# Patient Record
Sex: Male | Born: 1980 | Race: Black or African American | Hispanic: No | Marital: Single | Smoking: Never smoker
Health system: Southern US, Community
[De-identification: ages and names within clinical notes are randomized; demographics above are authoritative.]

## PROBLEM LIST (undated history)

## (undated) DIAGNOSIS — I10 Essential (primary) hypertension: Secondary | ICD-10-CM

## (undated) HISTORY — DX: Morbid (severe) obesity due to excess calories: E66.01

## (undated) HISTORY — DX: Essential (primary) hypertension: I10

---

## 2009-05-07 ENCOUNTER — Emergency Department (HOSPITAL_COMMUNITY): Admission: EM | Admit: 2009-05-07 | Discharge: 2009-05-07 | Payer: Self-pay | Admitting: Emergency Medicine

## 2009-06-06 ENCOUNTER — Emergency Department (HOSPITAL_COMMUNITY): Admission: EM | Admit: 2009-06-06 | Discharge: 2009-06-07 | Payer: Self-pay | Admitting: Emergency Medicine

## 2009-06-13 ENCOUNTER — Emergency Department (HOSPITAL_COMMUNITY): Admission: EM | Admit: 2009-06-13 | Discharge: 2009-06-13 | Payer: Self-pay | Admitting: Emergency Medicine

## 2009-11-28 IMAGING — CT CT MAXILLOFACIAL W/O CM
3 series · 18 of 30 positions shown, 20 images · non-contrast
Comparison: None

CLINICAL DATA: Trauma to the left side.  Swelling.

CT MAXILLOFACIAL WITHOUT CONTRAST
TECHNIQUE: Multidetector CT imaging of the maxillofacial
structures was performed. Multiplanar CT image reconstructions were
also generated.

[Series 3: recon 2: supine facial bones · axial · 0.51mm/px · z∈[-226,-88]mm · 6 of 78 slices shown, 8 images]
[im 12/78  brain]
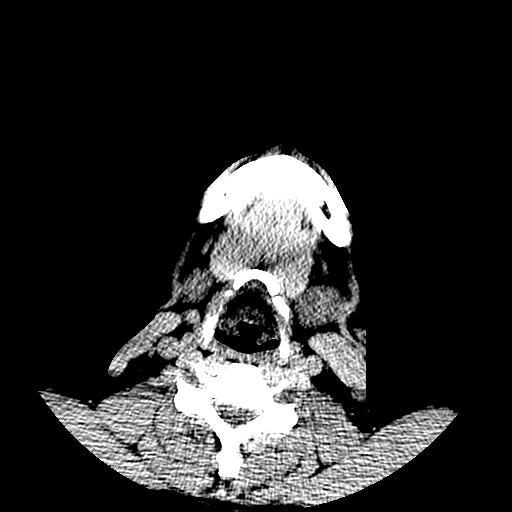
[im 12/78  bone]
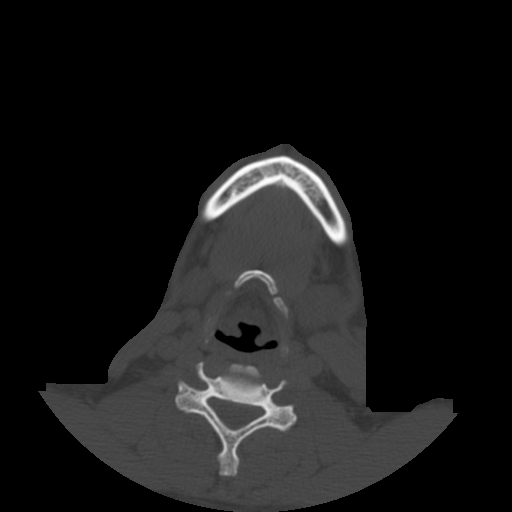
[im 23/78  bone]
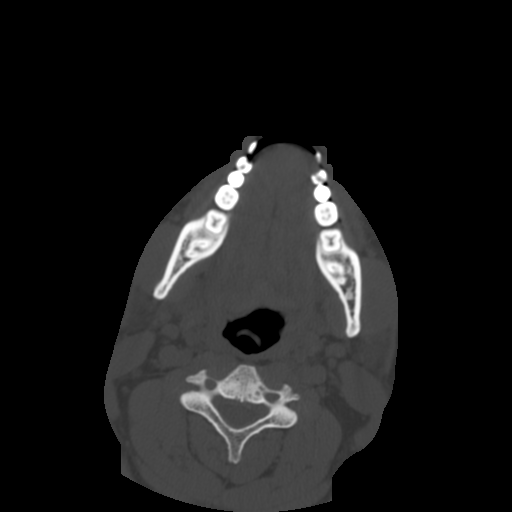
[im 34/78  bone]
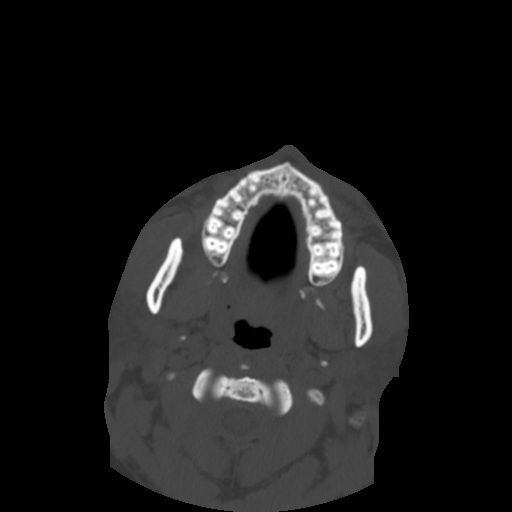
[im 45/78  bone]
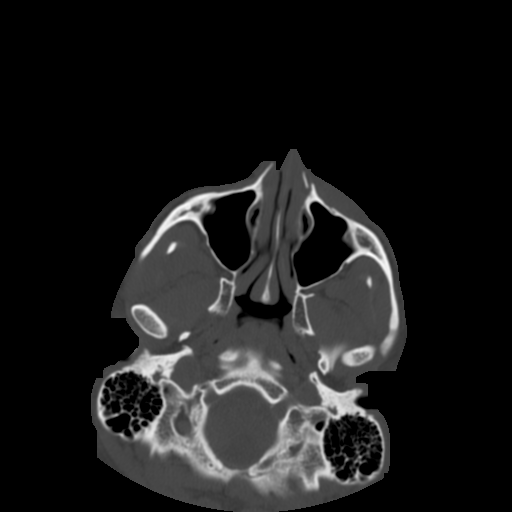
[im 56/78  brain]
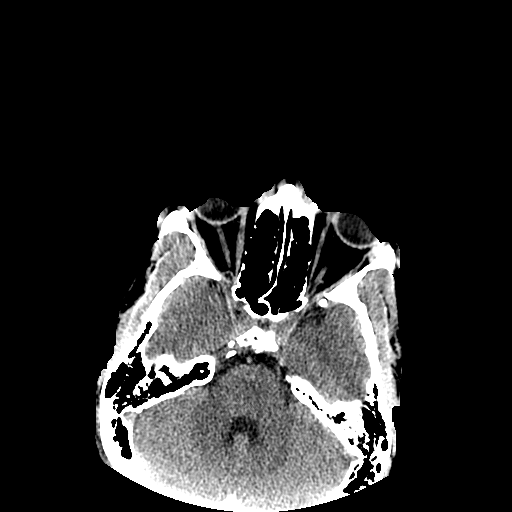
[im 56/78  bone]
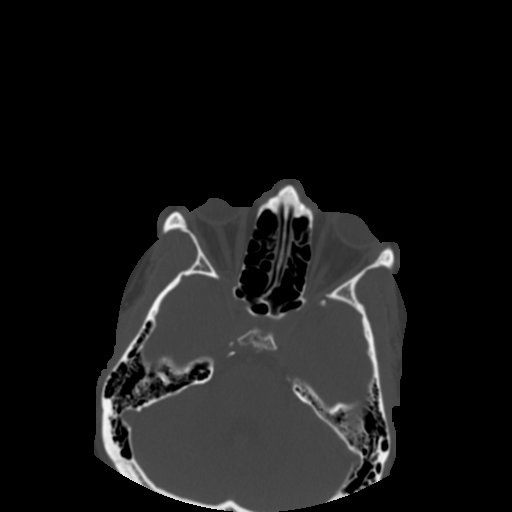
[im 67/78  bone]
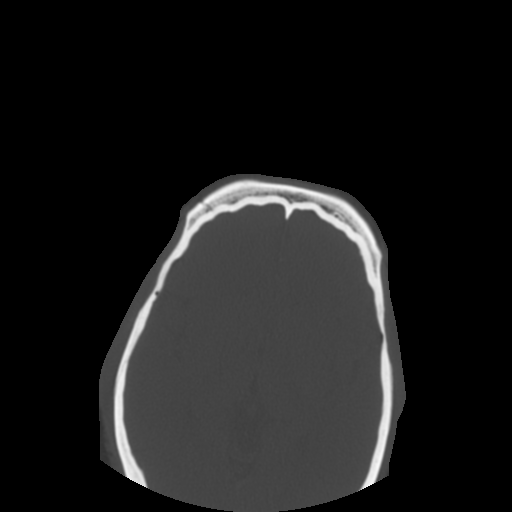

[Series 400: reformatted · sagittal · 0.51mm/px · 8 of 101 slices shown (1 of 2)]
[im 11/101  bone]
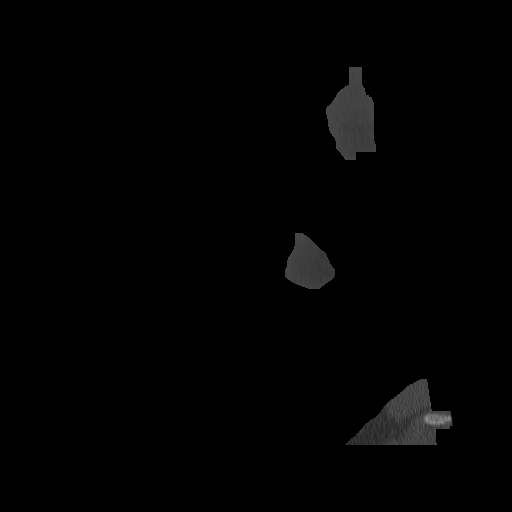
[im 21/101  bone]
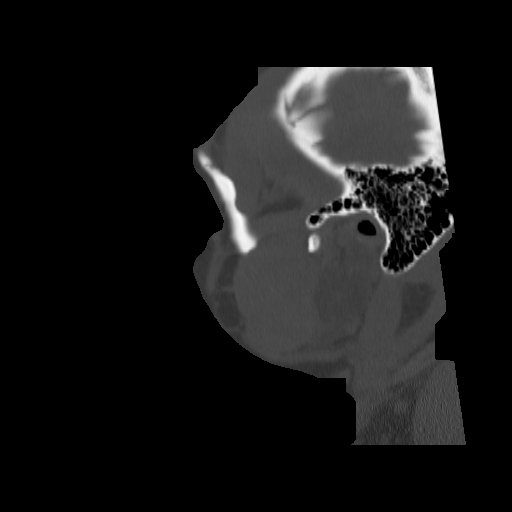
[im 31/101  bone]
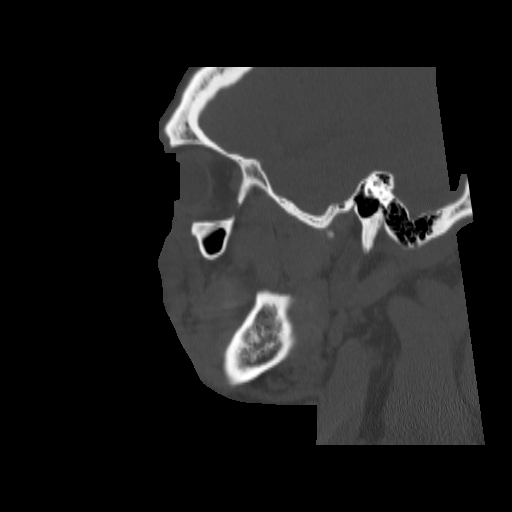
[im 41/101  bone]
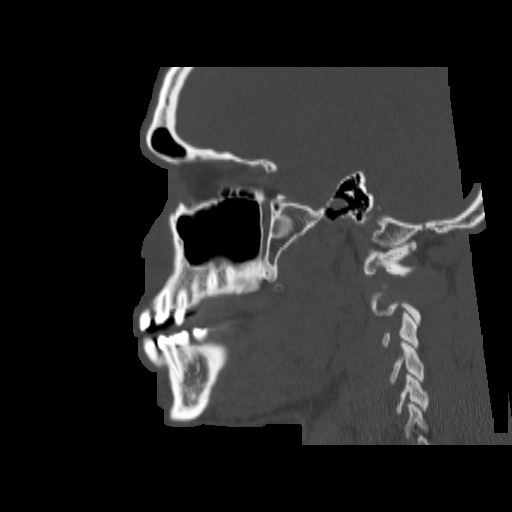
[im 61/101  bone]
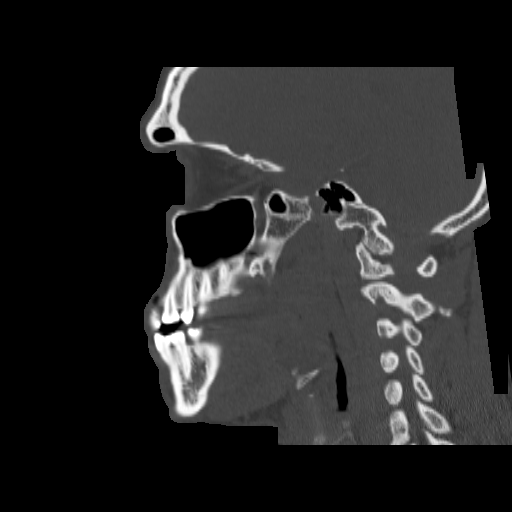
[im 71/101  bone]
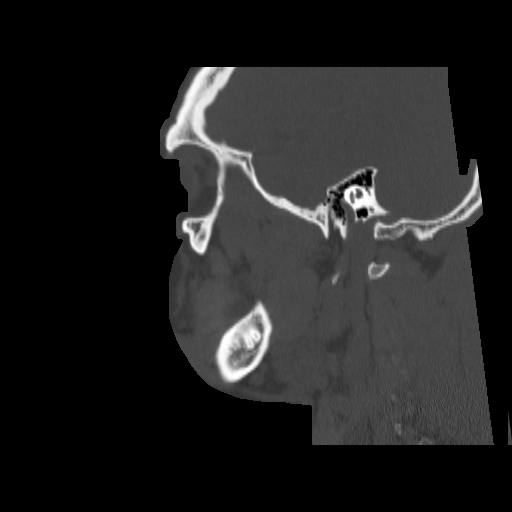
[im 81/101  bone]
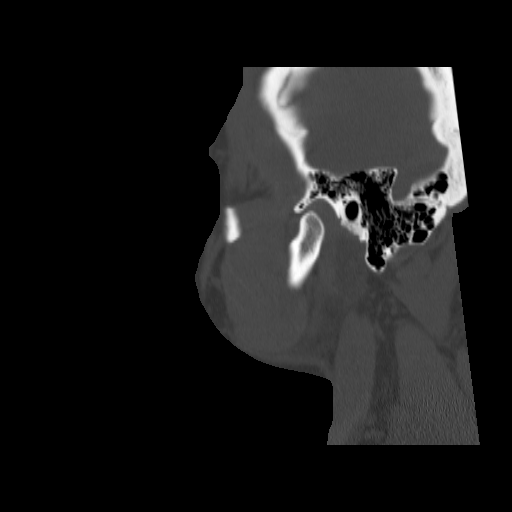
[im 91/101  bone]
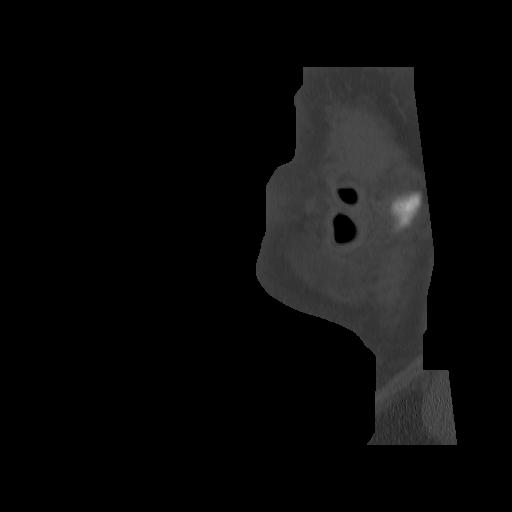

[Series 401: reformatted · coronal · 0.51mm/px · 4 of 103 slices shown (2 of 2)]
[im 11/103  bone]
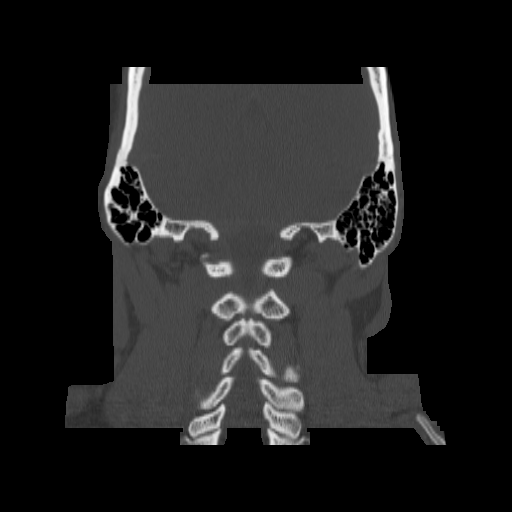
[im 21/103  bone]
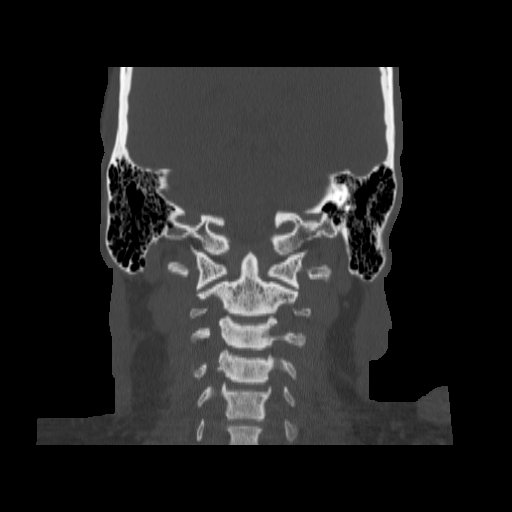
[im 31/103  bone]
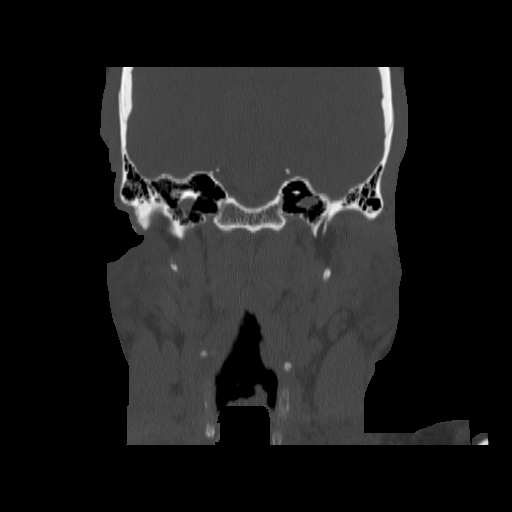
[im 41/103  bone]
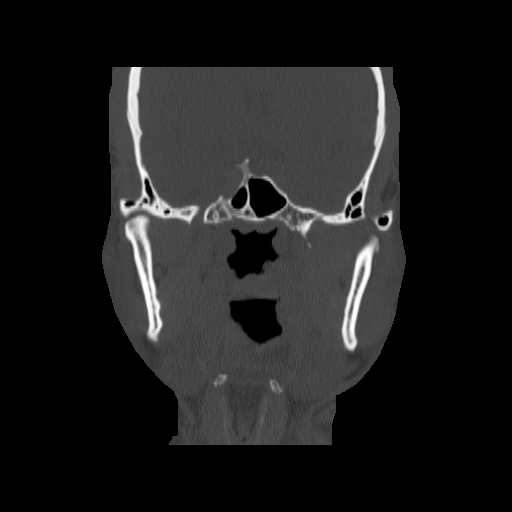

[18 of 30 positions shown; findings below may reference images not displayed]

FINDINGS: There are comminuted and depressed fractures of the nasal
bones on the left.  The nasal spine of the maxilla is intact.  No
other facial fractures seen.  No fluid in the sinuses.  There is
soft tissue swelling of the left cheek and periorbital soft
tissues.
IMPRESSION: Comminuted and depressed nasal fractures on the left.

## 2010-11-16 ENCOUNTER — Encounter: Payer: Self-pay | Admitting: Emergency Medicine

## 2018-06-06 ENCOUNTER — Ambulatory Visit: Payer: 59 | Admitting: Neurology

## 2018-06-06 VITALS — BP 144/102 | HR 72 | Ht 70.0 in | Wt 343.0 lb

## 2018-06-06 DIAGNOSIS — J351 Hypertrophy of tonsils: Secondary | ICD-10-CM | POA: Diagnosis not present

## 2018-06-06 DIAGNOSIS — J988 Other specified respiratory disorders: Secondary | ICD-10-CM

## 2018-06-06 DIAGNOSIS — Z6841 Body Mass Index (BMI) 40.0 and over, adult: Secondary | ICD-10-CM

## 2018-06-06 DIAGNOSIS — J4520 Mild intermittent asthma, uncomplicated: Secondary | ICD-10-CM

## 2018-06-06 DIAGNOSIS — Z9189 Other specified personal risk factors, not elsewhere classified: Secondary | ICD-10-CM | POA: Diagnosis not present

## 2018-06-06 DIAGNOSIS — R030 Elevated blood-pressure reading, without diagnosis of hypertension: Secondary | ICD-10-CM

## 2018-06-06 NOTE — Patient Instructions (Signed)

## 2018-06-06 NOTE — Progress Notes (Signed)
Subjective:    Patient ID: [name] [name] is a 37 y.o. male.  HPI     Huston FoleySaima Abdou Stocks, MD, PhD [facility] [address], [address] [address] [place], [place] [id number]  Dear [name] CramJanece,   I saw your patient, [name] [name], upon your kind request in my neurologic clinic today for initial consultation of his sleep disorder, in particular, concern for underlying obstructive sleep apnea. The patient is unaccompanied today. As you know, [name] is a 37 year old right-handed gentleman with an underlying medical history of hypertension and morbid obesity with BMI of over 45, who is at risk for obstructive sleep apnea and was advised to have a sleep study to rule out OSA based on his DOT physical as I understand. I reviewed your office note from 05/12/2018, which you kindly included. His Epworth sleepiness score is 4 out of 24, fatigue score is 9 out of 63. He is single and lives with his mother and his girlfriend. He has no children. He is a nonsmoker and does not utilize alcohol, drinks caffeine in the form of soda 2 per day and energy drinks 1 or 2 per day on average. He works at [employer], has a CDL. He has to be at work at [employer]6:15. Bedtime is around 10 PM and rise to him around 5:15. He denies night to night nocturia or restless leg symptoms. He denies morning headaches. He is not aware of any family history of OSA. His weight has been fluctuating, he is trying to lose weight and has lost about 5 pounds recently. He used to work third shift. He has a second part-time job. He is generally home by 7 PM. He is not aware of any snoring, girlfriend does not complain about it. He does not wake up gasping for air. He does take a nap in between his 2 jobs sometimes. His blood pressure is elevated today. He takes his blood pressure medicine each night around 11 PM. He watches TV in bed until he gets sleepy and turns it off. No pets in the bedroom.  His Past Medical  History Is Significant For: Past Medical History:  Diagnosis Date  . Benign hypertension   . Morbid obesity (HCC)     His Past Surgical History Is Significant For:  His Family History Is Significant For: No family history on file.  His Social History Is Significant For: Social History   Socioeconomic History  . Marital status: Single    Spouse name: Not on file  . Number of children: Not on file  . Years of education: Not on file  . Highest education level: Not on file  Occupational History  . Not on file  Social Needs  . Financial resource strain: Not on file  . Food insecurity:    Worry: Not on file    Inability: Not on file  . Transportation needs:    Medical: Not on file    Non-medical: Not on file  Tobacco Use  . Smoking status: Not on file  Substance and Sexual Activity  . Alcohol use: Not on file  . Drug use: Not on file  . Sexual activity: Not on file  Lifestyle  . Physical activity:    Days per week: Not on file    Minutes per session: Not on file  . Stress: Not on file  Relationships  . Social connections:    Talks on phone: Not on file    Gets together: Not on file  Attends religious service: Not on file    Active member of club or organization: Not on file    Attends meetings of clubs or organizations: Not on file    Relationship status: Not on file  Other Topics Concern  . Not on file  Social History Narrative  . Not on file    His Allergies Are:  No Known Allergies:   His Current Medications Are:  Outpatient Encounter Medications as of 06/06/2018  Medication Sig  . olmesartan-hydrochlorothiazide (BENICAR HCT) 20-12.5 MG tablet Take 1 tablet by mouth daily.  . [DISCONTINUED] telmisartan-hydrochlorothiazide (MICARDIS HCT) 40-12.5 MG tablet Take 1 tablet by mouth daily.   No facility-administered encounter medications on file as of 06/06/2018.   :  Review of Systems:  Out of a complete 14 point review of systems, all are reviewed and  negative with the exception of these symptoms as listed below:  Review of Systems  Neurological:       Pt presents today to discuss his sleep. Pt does not endorse snoring and has never had a sleep study.  Epworth Sleepiness Scale 0= would never doze 1= slight chance of dozing 2= moderate chance of dozing 3= high chance of dozing  Sitting and reading: 0 Watching TV: 1 Sitting inactive in a public place (ex. Theater or meeting): 0 As a passenger in a car for an hour without a break: 0 Lying down to rest in the afternoon: 3 Sitting and talking to someone: 0 Sitting quietly after lunch (no alcohol): 0 In a car, while stopped in traffic: 0 Total: 4    Objective:  Neurological Exam  Physical Exam Physical Examination:   Vitals:   06/06/18 0911  BP: (!) 144/102  Pulse: 72    General Examination: The patient is a very pleasant 37 y.o. male in no acute distress. He appears well-developed and well-nourished and well groomed.   HEENT: Normocephalic, atraumatic, pupils are equal, round and reactive to light and accommodation. Funduscopic exam is normal with sharp disc margins noted. Extraocular tracking is good without limitation to gaze excursion or nystagmus noted. Normal smooth pursuit is noted. Hearing is grossly intact. Tympanic membranes are clear bilaterally. Face is symmetric with normal facial animation and normal facial sensation. Speech is clear with no dysarthria noted. There is no hypophonia. There is no lip, neck/head, jaw or voice tremor. Neck is supple with full range of passive and active motion. There are no carotid bruits on auscultation. Oropharynx exam reveals: mild mouth dryness, adequate dental hygiene and marked airway crowding, due to smaller airway entry, thicker soft palate, large uvula and tonsils in place which were not fully visualized, Mallampati is class III. Tongue protrudes centrally and palate elevates symmetrically. Neck circumference is enlarged at 20-3/4  inches. He has a mild overbite. Nasal inspection reveals no significant mucosal swelling or redness or septal deviation or significant inferior turbinate hypertrophy.  Chest: Clear to auscultation without wheezing, rhonchi or crackles noted.  Heart: S1+S2+0, regular and normal without murmurs, rubs or gallops noted.   Abdomen: Soft, non-tender and non-distended with normal bowel sounds appreciated on auscultation.  Extremities: There is no pitting edema in the distal lower extremities bilaterally.   Skin: Warm and dry without trophic changes noted.  Musculoskeletal: exam reveals no obvious joint deformities, tenderness or joint swelling or erythema.   Neurologically:  Mental status: The patient is awake, alert and oriented in all 4 spheres. His immediate and remote memory, attention, language skills and fund of knowledge are appropriate.  There is no evidence of aphasia, agnosia, apraxia or anomia. Speech is clear with normal prosody and enunciation. Thought process is linear. Mood is normal and affect is normal.  Cranial nerves II - XII are as described above under HEENT exam. In addition: shoulder shrug is normal with equal shoulder height noted. Motor exam: Normal bulk, strength and tone is noted. There is no drift, tremor or rebound. Romberg is negative. Reflexes are 1+ throughout. Fine motor skills and coordination: intact with normal finger taps, normal hand movements, normal rapid alternating patting, normal foot taps and normal foot agility.  Cerebellar testing: No dysmetria or intention tremor on finger to nose testing. Heel to shin is slightly difficult d/t  Body habitus.   Sensory exam: intact to light touch in the upper and lower extremities.  Gait, station and balance: He stands easily. No veering to one side is noted. No leaning to one side is noted. Posture is age-appropriate and stance is narrow based. Gait shows normal stride length and normal pace. No problems turning are noted.  Tandem walk is slightly difficult d/t body habitus.    Assessment and Plan:  In summary, Jabe [name] is a very pleasant 37 y.o.-year old male with an underlying medical history of hypertension and morbid obesity with BMI of over 45, who presents for sleep evaluation because of underlying risk for obstructive sleep apnea. His history is not telltale for obstructive sleep apnea, nevertheless, based on morbid obesity, a crowded airway, and enlarged neck circumference, the pretest probability for obstructive sleep apnea is rather high. I explained my findings to the patient and advised him, that some patients do not have observed apneas or symptoms from underlying obstructive sleep apnea and it would be in his best medical interest to rule out OSA for which a lab attended sleep study is indicated. OSA cannot be ruled out based on a negative home sleep test.  I had a long chat with the patient about my findings and the diagnosis of OSA, its prognosis and treatment options. We talked about medical treatments, surgical interventions and non-pharmacological approaches. I explained in particular the risks and ramifications of untreated moderate to severe OSA, especially with respect to developing cardiovascular disease down the Road, including congestive heart failure, difficult to treat hypertension, cardiac arrhythmias, or stroke. Even type 2 diabetes has, in part, been linked to untreated OSA. Symptoms of untreated OSA include daytime sleepiness, memory problems, mood irritability and mood disorder such as depression and anxiety, lack of energy, as well as recurrent headaches, especially morning headaches. We talked about trying to maintain a healthy lifestyle in general, as well as the importance of weight control. I encouraged the patient to eat healthy, exercise daily and keep well hydrated, to keep a scheduled bedtime and wake time routine, to not skip any meals and eat healthy snacks in between meals. I  advised the patient not to drive when feeling sleepy. I recommended the following at this time: sleep study with potential positive airway pressure titration. (We will score hypopneas at 4%).   I explained the sleep test procedure to the patient and also outlined possible treatment, including the use of CPAP. He indicated that he would be willing to try CPAP if the need arises. I explained the importance of being compliant with PAP treatment, not only for insurance purposes but primarily to improve His symptoms, and for the patient's long term health benefit, including to reduce His cardiovascular risks. I answered all his questions today and the patient  was in agreement. I plan to see him back after the sleep study is completed and encouraged him to call with any interim questions, concerns, problems or updates.   Thank you very much for allowing me to participate in the care of this nice patient. If I can be of any further assistance to you please do not hesitate to call me at [phone].  Sincerely,   Star Age, MD, PhD

## 2018-06-15 ENCOUNTER — Ambulatory Visit: Payer: 59 | Admitting: Neurology

## 2018-06-15 DIAGNOSIS — J4520 Mild intermittent asthma, uncomplicated: Secondary | ICD-10-CM

## 2018-06-15 DIAGNOSIS — G4733 Obstructive sleep apnea (adult) (pediatric): Secondary | ICD-10-CM

## 2018-06-15 DIAGNOSIS — R030 Elevated blood-pressure reading, without diagnosis of hypertension: Secondary | ICD-10-CM

## 2018-06-15 DIAGNOSIS — Z9189 Other specified personal risk factors, not elsewhere classified: Secondary | ICD-10-CM

## 2018-06-15 DIAGNOSIS — Z6841 Body Mass Index (BMI) 40.0 and over, adult: Secondary | ICD-10-CM

## 2018-06-15 DIAGNOSIS — G4720 Circadian rhythm sleep disorder, unspecified type: Secondary | ICD-10-CM

## 2018-06-15 DIAGNOSIS — J988 Other specified respiratory disorders: Secondary | ICD-10-CM

## 2018-06-15 DIAGNOSIS — J351 Hypertrophy of tonsils: Secondary | ICD-10-CM

## 2018-06-19 ENCOUNTER — Telehealth: Payer: Self-pay

## 2018-06-19 NOTE — Addendum Note (Signed)
Addended by: Huston FoleyATHAR, Aiman Noe on: 06/19/2018 07:55 AM   Modules accepted: Orders

## 2018-06-19 NOTE — Telephone Encounter (Signed)
-----   Message from Huston FoleySaima Athar, MD sent at 06/19/2018  7:55 AM EDT ----- Patient referred by Arnette FeltsJanece Moore, NP, seen by me on 06/06/18, diagnostic PSG on 06/15/18.   Please call and notify the patient that the recent sleep study showed obstructive sleep apnea, in the mild to moderate range. I recommend treatment for this in the form of CPAP. This will require a repeat sleep study for proper titration and mask fitting and correct monitoring of the oxygen saturations. Please explain to patient. I have placed an order in the chart. Thanks.  Huston FoleySaima Athar, MD, PhD [facility])

## 2018-06-19 NOTE — Telephone Encounter (Signed)
I called pt to discuss his sleep study results. No answer, VM is full. Will try again later. 

## 2018-06-19 NOTE — Progress Notes (Signed)
See report

## 2018-06-19 NOTE — Procedures (Signed)
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 His Epworth Sleepiness Score is 1/24 points. The patient's weight 343 pounds with a height of 70 (inches), resulting in a BMI of 49.2 kg/m2. The patient's neck circumference measured 20.5 inches.  CURRENT MEDICATIONS: Benicar   PROCEDURE:  This is a multichannel digital polysomnogram utilizing the Somnostar 11.2 system.  Electrodes and sensors were applied and monitored per AASM Specifications.   EEG, EOG, Chin and Limb EMG, were sampled at 200 Hz.  ECG, Snore and Nasal Pressure, Thermal Airflow, Respiratory Effort, CPAP Flow and Pressure, Oximetry was sampled at 50 Hz. Digital video and audio were recorded.      BASELINE STUDY  Lights Out was at 21:12 and Lights On at 04:30, patient request an early rise time for work. Total recording time (TRT) was 438.5 minutes, with a total sleep time (TST) of 390.5 minutes.   The patient's sleep latency was 22.5 minutes.  REM latency was 82 minutes, which is normal. The sleep efficiency was 89.1 %.     SLEEP ARCHITECTURE: WASO (Wake after sleep onset) was 25.5 minutes with minimal to mild sleep fragmentation noted. There were 29.5 minutes in Stage N1, 246 minutes Stage N2, 42 minutes Stage N3 and 73 minutes in Stage REM.  The percentage of Stage N1 was 7.6%, Stage N2 was 63.%, which is increased, Stage N3 was 10.8% and Stage R (REM sleep) was 18.7%, which is near-normal. The arousals were noted as: 40 were spontaneous, 1 were associated with PLMs, 5 were associated with respiratory events.  RESPIRATORY ANALYSIS:  There were a total of 57 respiratory events:  4  obstructive apneas, 0 central apneas and 0 mixed apneas with a total of 4 apneas and an apnea index (AI) of .6 /hour. There were 53 hypopneas with a hypopnea index of 8.1 /hour. The patient also had 0 respiratory event related arousals (RERAs).      The total APNEA/HYPOPNEA INDEX (AHI) was 8.8/hour and the total RESPIRATORY DISTURBANCE INDEX was 8.8 /hour.  19 events occurred in REM sleep and 68 events in NREM. The REM AHI was 15.6 /hour, versus a non-REM AHI of 7.2. The patient spent 189 minutes of total sleep time in the supine position and 202 minutes in non-supine.. The supine AHI was 5.7 versus a non-supine AHI of 11.6.  OXYGEN SATURATION & C02:  The Wake baseline 02 saturation was 94%, with the lowest being 78%. Time spent below 89% saturation equaled 18 minutes.    PERIODIC LIMB MOVEMENTS: The patient had a total of 52 Periodic Limb Movements.  The Periodic Limb Movement (PLM) index was 8. and the PLM Arousal index was .2/hour. Audio and video analysis did not show any abnormal or unusual movements, behaviors, phonations or vocalizations. The patient took 1 bathroom break. Snoring was noted, ranging from mild to loud. The EKG was in keeping with normal sinus rhythm (NSR).  Post-study, the patient indicated that sleep was worse than usual.   IMPRESSION:  1. Obstructive Sleep Apnea (OSA) 2. Dysfunctions associated with sleep stages or arousal from  sleep  RECOMMENDATIONS:  1. This study demonstrates overall mild obstructive sleep apnea, moderate during REM sleep with a total AHI of 8.8/hour, REM AHI of 15.6/hour, and O2 nadir of 78%. Given the patient's medical history, and his DOT licensed, treatment with positive airway pressure is recommended. A full-night CPAP titration study is recommended to optimize therapy and for correct monitoring the O2 saturations. Weight loss is highly recommended. Other treatment options may include upper airway or jaw surgery in selected patients or the use of an  oral appliance in certain patients. ENT evaluation and/or consultation with a maxillofacial surgeon or dentist may be feasible in some instances.    2. This study shows minimal to mild sleep fragmentation and mildly abnormal sleep stage percentages; these are nonspecific findings and per se do not signify an intrinsic sleep disorder or a cause for the patient's sleep-related symptoms. Causes include (but are not limited to) the first night effect of the sleep study, circadian rhythm disturbances, medication effect or an underlying mood disorder or medical problem.  3. The patient should be cautioned not to drive, work at heights, or operate dangerous or heavy equipment when tired or sleepy. Review and reiteration of good sleep hygiene measures should be pursued with any patient. 4. The patient will be seen in follow-up in the sleep clinic at [facility] for discussion of the test results, symptom and treatment compliance review, further management strategies, etc. The referring provider will be notified of the test results.  I certify that I have reviewed the entire raw data recording prior to the issuance of this report in accordance with the Standards of Accreditation of the American Academy of Sleep Medicine (AASM)   Huston Foley, MD, PhD Diplomat, American Board of Neurology and Sleep Medicine (Neurology and Sleep Medicine)

## 2018-06-19 NOTE — Progress Notes (Signed)
Patient referred by Arnette FeltsJanece Moore, NP, seen by me on 06/06/18, diagnostic PSG on 06/15/18.   Please call and notify the patient that the recent sleep study showed obstructive sleep apnea, in the mild to moderate range. I recommend treatment for this in the form of CPAP. This will require a repeat sleep study for proper titration and mask fitting and correct monitoring of the oxygen saturations. Please explain to patient. I have placed an order in the chart. Thanks.  Huston FoleySaima Breken Nazari, MD, PhD [facility]

## 2018-06-19 NOTE — Telephone Encounter (Signed)
I called pt. I advised pt that Dr. Athar reviewed their sleep study results and found that pt has mild to moderate osa and recommends that pt be treated with a cpap. Dr. Athar recommends that pt return for a repeat sleep study in order to properly titrate the cpap and ensure a good mask fit. Pt is agreeable to returning for a titration study. I advised pt that our sleep lab will file with pt's insurance and call pt to schedule the sleep study when we hear back from the pt's insurance regarding coverage of this sleep study. Pt verbalized understanding of results. Pt had no questions at this time but was encouraged to call back if questions arise.   

## 2018-06-26 ENCOUNTER — Ambulatory Visit (INDEPENDENT_AMBULATORY_CARE_PROVIDER_SITE_OTHER): Payer: 59 | Admitting: Neurology

## 2018-06-26 DIAGNOSIS — J351 Hypertrophy of tonsils: Secondary | ICD-10-CM

## 2018-06-26 DIAGNOSIS — R030 Elevated blood-pressure reading, without diagnosis of hypertension: Secondary | ICD-10-CM

## 2018-06-26 DIAGNOSIS — G4733 Obstructive sleep apnea (adult) (pediatric): Secondary | ICD-10-CM

## 2018-06-26 DIAGNOSIS — Z9189 Other specified personal risk factors, not elsewhere classified: Secondary | ICD-10-CM

## 2018-06-26 DIAGNOSIS — J4520 Mild intermittent asthma, uncomplicated: Secondary | ICD-10-CM

## 2018-06-26 DIAGNOSIS — G4720 Circadian rhythm sleep disorder, unspecified type: Secondary | ICD-10-CM

## 2018-06-26 DIAGNOSIS — Z6841 Body Mass Index (BMI) 40.0 and over, adult: Secondary | ICD-10-CM

## 2018-06-29 ENCOUNTER — Telehealth: Payer: Self-pay

## 2018-06-29 NOTE — Telephone Encounter (Signed)
-----   Message from Huston Foley, MD sent at 06/29/2018  8:30 AM EDT ----- Patient referred by Arnette Felts, NP, seen by me on 06/06/18, diagnostic PSG on 06/15/18. Patient had a CPAP titration study on 06/26/18.  Please call and inform patient that I have entered an order for treatment with positive airway pressure (PAP) treatment for obstructive sleep apnea (OSA). He did well during the latest sleep study with CPAP. We will, therefore, arrange for a machine for home use through a DME (durable medical equipment) company of His choice; and I will see the patient back in follow-up in about 10 weeks. Please also explain to the patient that I will be looking out for compliance data, which can be downloaded from the machine (stored on an SD card, that is inserted in the machine) or via remote access through a modem, that is built into the machine. At the time of the followup appointment we will discuss sleep study results and how it is going with PAP treatment at home. Please advise patient to bring His machine at the time of the first FU visit, even though this is cumbersome. Bringing the machine for every visit after that will likely not be needed, but often helps for the first visit to troubleshoot if needed. Please re-enforce the importance of compliance with treatment and the need for [place] to monitor compliance data - often an insurance requirement and actually good feedback for the patient as far as how they are doing.  Also remind patient, that any interim PAP machine or mask issues should be first addressed with the DME company, as they can often help better with technical and mask fit issues. Please ask if patient has a preference regarding DME company.  Please also make sure, the patient has a follow-up appointment with me in about 10 weeks from the setup date, thanks. May see one of our nurse practitioners if needed for proper timing of the FU appointment.  Please fax or rout report to the referring provider.  Thanks,   Huston Foley, MD, PhD [facility])

## 2018-06-29 NOTE — Addendum Note (Signed)
Addended by: Huston Foley on: 06/29/2018 08:30 AM   Modules accepted: Orders

## 2018-06-29 NOTE — Progress Notes (Signed)
Patient referred by Arnette Felts, NP, seen by me on 06/06/18, diagnostic PSG on 06/15/18. Patient had a CPAP titration study on 06/26/18.  Please call and inform patient that I have entered an order for treatment with positive airway pressure (PAP) treatment for obstructive sleep apnea (OSA). He did well during the latest sleep study with CPAP. We will, therefore, arrange for a machine for home use through a DME (durable medical equipment) company of His choice; and I will see the patient back in follow-up in about 10 weeks. Please also explain to the patient that I will be looking out for compliance data, which can be downloaded from the machine (stored on an SD card, that is inserted in the machine) or via remote access through a modem, that is built into the machine. At the time of the followup appointment we will discuss sleep study results and how it is going with PAP treatment at home. Please advise patient to bring His machine at the time of the first FU visit, even though this is cumbersome. Bringing the machine for every visit after that will likely not be needed, but often helps for the first visit to troubleshoot if needed. Please re-enforce the importance of compliance with treatment and the need for [place] to monitor compliance data - often an insurance requirement and actually good feedback for the patient as far as how they are doing.  Also remind patient, that any interim PAP machine or mask issues should be first addressed with the DME company, as they can often help better with technical and mask fit issues. Please ask if patient has a preference regarding DME company.  Please also make sure, the patient has a follow-up appointment with me in about 10 weeks from the setup date, thanks. May see one of our nurse practitioners if needed for proper timing of the FU appointment.  Please fax or rout report to the referring provider. Thanks,   Huston Foley, MD, PhD [facility])

## 2018-06-29 NOTE — Procedures (Signed)
PATIENT'S NAME:  [name], [name] DOB:      Jun 02, 1981      MR#:    [id number]     DATE OF RECORDING: 06/26/2018 REFERRING M.D.:  Arnette Felts, FNP Study Performed:   CPAP  Titration HISTORY:  37 year old man with a history of hypertension and morbid obesity with BMI of over 45, who returns for a CPAP titration study. His baseline PSG on 06/15/18 showed an AHI of 8.8/hour and low spo2 of 78%. The patient endorsed the Epworth Sleepiness Scale at 1 points. The patient's weight 344 pounds with a height of 70 (inches), resulting in a BMI of 49.2 kg/m2. The patient's neck circumference measured 20 inches.  CURRENT MEDICATIONS: Benicar   PROCEDURE:  This is a multichannel digital polysomnogram utilizing the SomnoStar 11.2 system.  Electrodes and sensors were applied and monitored per AASM Specifications.   EEG, EOG, Chin and Limb EMG, were sampled at 200 Hz.  ECG, Snore and Nasal Pressure, Thermal Airflow, Respiratory Effort, CPAP Flow and Pressure, Oximetry was sampled at 50 Hz. Digital video and audio were recorded.      The patient was fitted with MW Dreamwear nasal interface. CPAP was initiated at 5 cmH20 with heated humidity per AASM standards and pressure was advanced to 11 cm H20 because of hypopneas, apneas and desaturations.  At a PAP pressure of 11 cmH20, there was a reduction of the AHI to 0/hour with non-supine REM sleep achieved and O2 nadir of 92%.  Lights Out was at 21:50 and Lights On at 03:59. Total recording time (TRT) was 370 minutes, with a total sleep time (TST) of 345 minutes. The patient's sleep latency was 14.5 minutes. REM latency was 59.5 minutes, which is mildly reduced. The sleep efficiency was 93.2%.    SLEEP ARCHITECTURE: WASO (Wake after sleep onset) was 10.5 minutes with minimal sleep fragmentation noted. There were 16.5 minutes in Stage N1, 139.5 minutes Stage N2, 89 minutes Stage N3 and 100 minutes in Stage REM. The percentage of Stage N1 was 4.8%, Stage N2 was 40.4%, Stage N3  was 25.8%, which is mildly increased, and Stage R (REM sleep) was 29%, which is mildly increased and in keeping with rebound.   RESPIRATORY ANALYSIS:  There was a total of 9 respiratory events: 0 obstructive apneas, 0 central apneas and 0 mixed apneas with a total of 0 apneas and an apnea index (AI) of 0 /hour. There were 9 hypopneas with a hypopnea index of 1.6/hour. The patient also had 0 respiratory event related arousals (RERAs).      The total APNEA/HYPOPNEA INDEX (AHI) was 1.6 /hour and the total RESPIRATORY DISTURBANCE INDEX was 1.6 /hour  6 events occurred in REM sleep and 3 events in NREM. The REM AHI was 3.6 /hour versus a non-REM AHI of .7 /hour.  The patient spent 130 minutes of total sleep time in the supine position and 215 minutes in non-supine. The supine AHI was 2.3, versus a non-supine AHI of 1.1.  OXYGEN SATURATION & C02:  The baseline 02 saturation was 96%, with the lowest being 86%. Time spent below 89% saturation equaled 1 minutes.  PERIODIC LIMB MOVEMENTS:  The patient had a total of 0 Periodic Limb Movements. The Periodic Limb Movement (PLM) index was 0 and the PLM Arousal index was 0 /hour.   Audio and video analysis did not show any abnormal or unusual movements, behaviors, phonations or vocalizations. The patient took no bathroom breaks. The EKG was in keeping with normal sinus rhythm.  Post-study, the patient indicated that sleep was the same as usual (breathing was better, he stated).   IMPRESSION:   1. Obstructive Sleep Apnea (OSA) 2. Dysfunctions associated with sleep stages or arousal from sleep   RECOMMENDATIONS:   1. This study demonstrates resolution of the patient's obstructive sleep apnea with CPAP therapy. I will, therefore, start the patient on home CPAP treatment at a pressure of 11 cm via mediumwide nasal cushion interface with heated humidity. The patient should be reminded to be fully compliant with PAP therapy to improve sleep related symptoms and  decrease long term cardiovascular risks. The patient should be reminded, that it may take up to 3 months to get fully used to using PAP with all planned sleep. The earlier full compliance is achieved, the better long term compliance tends to be. Please note that untreated obstructive sleep apnea may carry additional perioperative morbidity. Patients with significant obstructive sleep apnea should receive perioperative PAP therapy and the surgeons and particularly the anesthesiologist should be informed of the diagnosis and the severity of the sleep disordered breathing. 2. This study shows minimal sleep fragmentation and mildly abnormal sleep stage percentages; these are nonspecific findings and per se do not signify an intrinsic sleep disorder or a cause for the patient's sleep-related symptoms. Causes include (but are not limited to) the first night effect of the sleep study, circadian rhythm disturbances, medication effect or an underlying mood disorder or medical problem.  3. The patient should be cautioned not to drive, work at heights, or operate dangerous or heavy equipment when tired or sleepy. Review and reiteration of good sleep hygiene measures should be pursued with any patient. 4. The patient will be seen in follow-up in the sleep clinic at [facility] for discussion of the test results, symptom and treatment compliance review, further management strategies, etc. The referring provider will be notified of the test results.   I certify that I have reviewed the entire raw data recording prior to the issuance of this report in accordance with the Standards of Accreditation of the American Academy of Sleep Medicine (AASM)     Huston Foley, MD, PhD Diplomat, American Board of Neurology and Sleep Medicine (Neurology and Sleep Medicine)

## 2018-06-29 NOTE — Telephone Encounter (Signed)
I called pt. I advised pt that Dr. Frances Furbish reviewed their sleep study results and found that pt did well with the cpap during her latest sleep study. Dr. Frances Furbish recommends that pt start a cpap at home. I reviewed PAP compliance expectations with the pt. Pt is agreeable to starting a CPAP. I advised pt that an order will be sent to a DME, [facility], and [facility] will call the pt within about one week after they file with the pt's insurance. [facility] will show the pt how to use the machine, fit for masks, and troubleshoot the CPAP if needed. A follow up appt was made for insurance purposes with Dr. Frances Furbish on 09/19/18 at 3:00pm. Pt verbalized understanding to arrive 15 minutes early and bring their CPAP. A letter with all of this information in it will be mailed to the pt as a reminder. I verified with the pt that the address we have on file is correct. Pt verbalized understanding of results. Pt had no questions at this time but was encouraged to call back if questions arise.

## 2018-07-13 DIAGNOSIS — G4730 Sleep apnea, unspecified: Secondary | ICD-10-CM

## 2018-07-13 DIAGNOSIS — I10 Essential (primary) hypertension: Secondary | ICD-10-CM | POA: Diagnosis not present

## 2018-07-24 ENCOUNTER — Institutional Professional Consult (permissible substitution): Payer: Self-pay | Admitting: Neurology

## 2018-07-24 ENCOUNTER — Encounter

## 2018-09-15 ENCOUNTER — Telehealth: Payer: Self-pay

## 2018-09-15 NOTE — Telephone Encounter (Signed)
Attempted to call pt to see where he wants me to send his results to he did not provide a fax number for us. Left v/m to call office. YRL,RMA

## 2018-09-17 ENCOUNTER — Encounter: Payer: Self-pay | Admitting: Neurology

## 2018-09-19 ENCOUNTER — Encounter: Payer: Self-pay | Admitting: Neurology

## 2018-09-19 ENCOUNTER — Ambulatory Visit (INDEPENDENT_AMBULATORY_CARE_PROVIDER_SITE_OTHER): Payer: 59 | Admitting: Neurology

## 2018-09-19 VITALS — BP 114/70 | HR 77 | Ht 72.0 in | Wt 350.0 lb

## 2018-09-19 DIAGNOSIS — Z9989 Dependence on other enabling machines and devices: Secondary | ICD-10-CM

## 2018-09-19 DIAGNOSIS — G4733 Obstructive sleep apnea (adult) (pediatric): Secondary | ICD-10-CM

## 2018-09-19 NOTE — Progress Notes (Signed)
Subjective:    Patient ID: [name] [name] is a 37 y.o. male.  HPI     Interim history:   [name] is a 37 year old right-handed gentleman with an underlying medical history of hypertension and morbid obesity with BMI of over 67, who presents for follow-up consultation of his obstructive sleep apnea, after recent sleep study testing and starting CPAP therapy. The patient is unaccompanied today. 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 He was advised to proceed with sleep study testing. He had a baseline sleep study, followed by a CPAP titration study. His baseline sleep study from 06/15/2018 showed a sleep efficiency of 89.1%, sleep latency of 22.5 minutes, REM latency of 82 minutes. He had a total AHI of 8.8 per hour, rising to 15.6 per hour during REM sleep. Average oxygen saturation was 94%, nadir was 78%. He had no significant PLMS. He was advised to proceed with CPAP therapy and return for titration study. He had this on 06/26/2018. Sleep latency was 14.5 minutes, sleep efficiency 93.2%, REM latency borderline reduced at 59.5 minutes. He had slightly increased percentage of REM sleep at 29%. He was titrated on CPAP from 5 cm to 11 cm. On the final pressure his AHI was 0 per hour with nonsupine REM sleep achieved and O2 nadir of 92%. He had no significant PLMS during the study. Based on his test results I prescribed CPAP therapy for home use at a pressure of 11 cm.   Today, 09/19/2018: I reviewed his CPAP compliance data from 08/19/2018 through 09/17/2018 which is a total of 30 days, during which time he used his device 29 days with percent used days greater than 4 hours at 76.7%, indicating adequate compliance with an average usage of 4 hours and 47 minutes, residual AHI at goal at 1.3 per hour, leak on the higher end, CPAP pressure of 11 cm. He reports doing fairly well with his  CPAP, he tries to put it on consistently but sometimes does fall asleep without it. He has noticed some improvement in his ability to breathe at night, otherwise no telltale changes in his sleep quality or sleep consolidation.  The patient's allergies, current medications, family history, past medical history, past social history, past surgical history and problem list were reviewed and updated as appropriate.   Previously:   06/06/2018: (He) is at risk for obstructive sleep apnea and was advised to have a sleep study to rule out OSA based on his DOT physical as I understand. I reviewed your office note from 05/12/2018, which you kindly included. His Epworth sleepiness score is 4 out of 24, fatigue score is 9 out of 63. He is single and lives with his mother and his girlfriend. He has no children. He is a nonsmoker and does not utilize alcohol, drinks caffeine in the form of soda 2 per day and energy drinks 1 or 2 per day on average. He works at [employer], has a [place]. He has to be at work at [employer]. Bedtime is around 10 PM and rise to him around 5:15. He denies night to night nocturia or restless leg symptoms. He denies morning headaches. He is not aware of any family history of OSA. His weight has been fluctuating, he is trying to lose weight and has lost about 5 pounds recently. He used to work third shift. He  has a second part-time job. He is generally home by 7 PM. He is not aware of any snoring, girlfriend does not complain about it. He does not wake up gasping for air. He does take a nap in between his 2 jobs sometimes. His blood pressure is elevated today. He takes his blood pressure medicine each night around 11 PM. He watches TV in bed until he gets sleepy and turns it off. No pets in the bedroom.   His Past Medical History Is Significant For: Past Medical History:  Diagnosis Date  . Benign hypertension   . Morbid obesity ([place])     His Past Surgical History Is Significant  For: History reviewed. No pertinent surgical history.  His Family History Is Significant For: History reviewed. No pertinent family history.  His Social History Is Significant For: Social History   Socioeconomic History  . Marital status: Single    Spouse name: Not on file  . Number of children: Not on file  . Years of education: Not on file  . Highest education level: Not on file  Occupational History  . Not on file  Social Needs  . Financial resource strain: Not on file  . Food insecurity:    Worry: Not on file    Inability: Not on file  . Transportation needs:    Medical: Not on file    Non-medical: Not on file  Tobacco Use  . Smoking status: Never Smoker  . Smokeless tobacco: Never Used  Substance and Sexual Activity  . Alcohol use: Not on file  . Drug use: Never  . Sexual activity: Not on file  Lifestyle  . Physical activity:    Days per week: Not on file    Minutes per session: Not on file  . Stress: Not on file  Relationships  . Social connections:    Talks on phone: Not on file    Gets together: Not on file    Attends religious service: Not on file    Active member of club or organization: Not on file    Attends meetings of clubs or organizations: Not on file    Relationship status: Not on file  Other Topics Concern  . Not on file  Social History Narrative  . Not on file    His Allergies Are:  No Known Allergies:   His Current Medications Are:  Outpatient Encounter Medications as of 09/19/2018  Medication Sig  . olmesartan-hydrochlorothiazide (BENICAR HCT) 20-12.5 MG tablet Take 1 tablet by mouth daily.   No facility-administered encounter medications on file as of 09/19/2018.   :  Review of Systems:  Out of a complete 14 point review of systems, all are reviewed and negative with the exception of these symptoms as listed below: Review of Systems  Neurological:       Patient mentioned that since he has been using his cpap machine he noticed  that his breathing has improved.    Objective:  Neurological Exam  Physical Exam Physical Examination:   Vitals:   09/19/18 1513  BP: 114/70  Pulse: 77    General Examination: The patient is a very pleasant 37 y.o. male in no acute distress. He appears well-developed and well-nourished and well groomed.   HEENT: Normocephalic, atraumatic, pupils are equal, round and reactive to light and accommodation. Extraocular tracking is good without limitation to gaze excursion or nystagmus noted. Normal smooth pursuit is noted. Hearing is grossly intact. Face is symmetric with normal facial animation and normal facial  sensation. Speech is clear with no dysarthria noted. There is no hypophonia. There is no lip, neck/head, jaw or voice tremor. Neck shows FROM. Oropharynx exam reveals: mild mouth dryness, adequate dental hygiene and marked airway crowding, Mallampati is class III. Tongue protrudes centrally and palate elevates symmetrically.   Chest: Clear to auscultation without wheezing, rhonchi or crackles noted.  Heart: S1+S2+0, regular and normal without murmurs, rubs or gallops noted.   Abdomen: Soft, non-tender and non-distended.  Extremities: There is no pitting edema in the distal lower extremities bilaterally.   Skin: Warm and dry without trophic changes noted.  Musculoskeletal: exam reveals no obvious joint deformities, tenderness or joint swelling or erythema.   Neurologically:  Mental status: The patient is awake, alert and oriented in all 4 spheres. His immediate and remote memory, attention, language skills and fund of knowledge are appropriate. There is no evidence of aphasia, agnosia, apraxia or anomia. Speech is clear with normal prosody and enunciation. Thought process is linear. Mood is normal and affect is normal.  Cranial nerves II - XII are as described above under HEENT exam. Motor exam: Normal bulk, strength and tone is noted. There is no drift, tremor or rebound.  Romberg is negative. Reflexes are 1+ throughout. Fine motor skills and coordination: intact with normal finger taps, normal hand movements, normal rapid alternating patting, normal foot taps and normal foot agility.  Cerebellar testing: No dysmetria or intention tremor.   Sensory exam: intact to light touch in the upper and lower extremities.  Gait, station and balance: He stands easily. No veering to one side is noted. No leaning to one side is noted. Posture is age-appropriate and stance is narrow based. Gait shows normal stride length and normal pace. No problems turning are noted. Tandem walk is slightly difficult d/t body habitus, stable.    Assessment and Plan:  In summary, Polo [name] is a very pleasant 37 year old male with an underlying medical history of hypertension and morbid obesity with BMI of over 15, who presents for follow up consultation of his obstructive sleep apnea after baseline sleep study testing in August 2019 followed by a CPAP titration study in May 2019. He has established treatment at home with CPAP therapy. He was encouraged to seek treatment for his mild sleep apnea. He is compliant with treatment and has noticed improvement in his breathing at night and also reports significant improvement in his snoring per girlfriend's report. He is commended on his treatment adherence, I encouraged him to try to stay on treatment consistently and try to increase his total sleep time as well. His average usage is nearly 5 hours at this point. He is advised to work on weight loss as this may very well improve his obstructive sleep apnea. He is advised to follow-up routinely in 6 months, sooner if needed, he can see one of our nurse practitioners next time. I answered all his questions today and he was in agreement. I spent 25 minutes in total face-to-face time with the patient, more than 50% of which was spent in counseling and coordination of care, reviewing test results, reviewing  medication and discussing or reviewing the diagnosis of OSA, its prognosis and treatment options. Pertinent laboratory and imaging test results that were available during this visit with the patient were reviewed by me and considered in my medical decision making (see chart for details).

## 2018-09-19 NOTE — Patient Instructions (Signed)
Please continue using your CPAP regularly. While your insurance requires that you use CPAP at least 4 hours each night on 70% of the nights, I recommend, that you not skip any nights and use it throughout the night if you can. Getting used to CPAP and staying with the treatment long term does take time and patience and discipline. Untreated obstructive sleep apnea when it is moderate to severe can have an adverse impact on cardiovascular health and raise her risk for heart disease, arrhythmias, hypertension, congestive heart failure, stroke and diabetes. Untreated obstructive sleep apnea causes sleep disruption, nonrestorative sleep, and sleep deprivation. This can have an impact on your day to day functioning and cause daytime sleepiness and impairment of cognitive function, memory loss, mood disturbance, and problems focussing. Using CPAP regularly can improve these symptoms.  Keep up the good work! We can see you in 6 months, you can see one of our nurse practitioners as you are stable.

## 2018-09-19 NOTE — Progress Notes (Deleted)
Subjective:    Patient ID: [name] [name] is a 37 y.o. male.  HPI {Common ambulatory SmartLinks:[id number]}  Review of Systems  Objective:  Neurological Exam  Physical Exam  Assessment:   ***  Plan:   ***

## 2018-10-12 ENCOUNTER — Ambulatory Visit: Payer: 59 | Admitting: Nurse Practitioner

## 2018-12-26 ENCOUNTER — Encounter: Payer: Self-pay | Admitting: Adult Health

## 2019-03-09 ENCOUNTER — Other Ambulatory Visit: Payer: Self-pay | Admitting: Nurse Practitioner

## 2019-03-22 ENCOUNTER — Encounter: Payer: Self-pay | Admitting: Nurse Practitioner

## 2019-03-22 ENCOUNTER — Other Ambulatory Visit: Payer: Self-pay

## 2019-03-22 ENCOUNTER — Ambulatory Visit: Payer: 59 | Admitting: Nurse Practitioner

## 2019-03-22 VITALS — BP 122/88 | HR 68 | Temp 98.3°F | Ht 70.4 in | Wt 356.0 lb

## 2019-03-22 DIAGNOSIS — Z6841 Body Mass Index (BMI) 40.0 and over, adult: Secondary | ICD-10-CM | POA: Diagnosis not present

## 2019-03-22 DIAGNOSIS — I10 Essential (primary) hypertension: Secondary | ICD-10-CM | POA: Diagnosis not present

## 2019-03-22 LAB — BMP8+EGFR
BUN/Creatinine Ratio: 8 — ABNORMAL LOW (ref 9–20)
BUN: 9 mg/dL (ref 6–24)
CO2: 24 mmol/L (ref 20–29)
Calcium: 9.7 mg/dL (ref 8.7–10.2)
Chloride: 102 mmol/L (ref 96–106)
Creatinine, Ser: 1.08 mg/dL (ref 0.76–1.27)
GFR calc Af Amer: 99 mL/min/{1.73_m2} (ref >59)
GFR calc non Af Amer: 85 mL/min/{1.73_m2} (ref >59)
Glucose: 90 mg/dL (ref 65–99)
Potassium: 4 mmol/L (ref 3.5–5.2)
Sodium: 142 mmol/L (ref 134–144)

## 2019-03-22 MED ORDER — OLMESARTAN MEDOXOMIL-HCTZ 20-12.5 MG PO TABS: 1.0000 | ORAL_TABLET | Freq: Every day | ORAL | 0 refills | Status: DC

## 2019-03-22 NOTE — Progress Notes (Signed)
  Subjective:     Patient ID: [name] [name] , male    DOB: 1981-08-07 , 38 y.o.   MRN: [id number]   Chief Complaint  Patient presents with  . Hypertension    HPI  Hypertension  This is a chronic problem. The current episode started more than 1 year ago. The problem is unchanged. The problem is controlled. Pertinent negatives include no anxiety or chest pain. There are no associated agents to hypertension. Risk factors for coronary artery disease include obesity, sedentary lifestyle and male gender. Past treatments include diuretics and angiotensin blockers. There are no compliance problems.  There is no history of angina. Identifiable causes of hypertension include chronic renal disease.     Past Medical History:  Diagnosis Date  . Benign hypertension   . Morbid obesity ([place])      No family history on file.   Current Outpatient Medications:  .  olmesartan-hydrochlorothiazide (BENICAR HCT) 20-12.5 MG tablet, Take 1 tablet by mouth daily., Disp: , Rfl:    No Known Allergies   Review of Systems  Cardiovascular: Negative for chest pain.     Today's Vitals   03/22/19 0926  BP: 122/88  Pulse: 68  Temp: 98.3 F (36.8 C)  TempSrc: Oral  Weight: (!) 356 lb (161.5 kg)  Height: 5' 10.4" (1.788 m)  PainSc: 0-No pain   Body mass index is 50.5 kg/m.   Objective:  Physical Exam Vitals signs reviewed.  Constitutional:      Appearance: Normal appearance. He is obese.     Comments: Central obesity  Cardiovascular:     Rate and Rhythm: Normal rate and regular rhythm.     Pulses: Normal pulses.     Heart sounds: Normal heart sounds. No murmur.  Pulmonary:     Effort: Pulmonary effort is normal.     Breath sounds: Normal breath sounds.  Skin:    General: Skin is warm and dry.     Capillary Refill: Capillary refill takes less than 2 seconds.  Neurological:     General: No focal deficit present.     Mental Status: He is alert and oriented to person, place, and time.   Psychiatric:        Mood and Affect: Mood normal.        Behavior: Behavior normal.        Thought Content: Thought content normal.        Judgment: Judgment normal.         Assessment And Plan:     1. Essential hypertension . B/P is controlled.  . CMP ordered to check renal function.  . The importance of regular exercise and dietary modification was stressed to the patient.  . Stressed importance of losing ten percent of her body weight to help with B/P control.  . The weight loss would help with decreasing cardiac and cancer risk as well.  - BMP8+eGFR  2. Morbid (severe) obesity due to excess calories [facility])  He has gained 13 lbs since September  I have discussed with him to drink a protein drink or boiled eggs with nuts if he does not want to eat.  Goal to lose 15 lbs by his HM visit in August  3. BMI 50.0-59.9, adult ([place])    Minette Brine, FNP    THE PATIENT IS ENCOURAGED TO PRACTICE SOCIAL DISTANCING DUE TO THE COVID-19 PANDEMIC.

## 2019-03-22 NOTE — Patient Instructions (Signed)
BMI for Adults    Body mass index (BMI) is a number that is calculated from a person's weight and height. BMI may help to estimate how much of a person's weight is composed of fat. BMI can help identify those who may be at higher risk for certain medical problems.  How is BMI used with adults?  BMI is used as a screening tool to identify possible weight problems. It is used to check whether a person is obese, overweight, healthy weight, or underweight.  How is BMI calculated?  BMI measures your weight and compares it to your height. This can be done either in English (U[place] or metric measurements. Note that charts are available to help you find your BMI quickly and easily without having to do these calculations yourself.  To calculate your BMI in English ([place] measurements, your health care provider will:  1. Measure your weight in pounds (lb).  2. Multiply the number of pounds by 703.  ? For example, for a person who weighs 180 lb, multiply that number by 703, which equals 126,540.  3. Measure your height in inches (in). Then multiply that number by itself to get a measurement called "inches squared."  ? For example, for a person who is 70 in tall, the "inches squared" measurement is 70 in x 70 in, which equals 4900 inches squared.  4. Divide the total from Step 2 (number of lb x 703) by the total from Step 3 (inches squared): 126,540 ÷ 4900 = 25.8. This is your BMI.  To calculate your BMI in metric measurements, your health care provider will:  1. Measure your weight in kilograms (kg).  2. Measure your height in meters (m). Then multiply that number by itself to get a measurement called "meters squared."  ? For example, for a person who is 1.75 m tall, the "meters squared" measurement is 1.75 m x 1.75 m, which is equal to 3.1 meters squared.  3. Divide the number of kilograms (your weight) by the meters squared number. In this example: 70 ÷ 3.1 = 22.6. This is your BMI.  How is BMI interpreted?  To interpret your  results, your health care provider will use BMI charts to identify whether you are underweight, normal weight, overweight, or obese. The following guidelines will be used:  · Underweight: BMI less than 18.5.  · Normal weight: BMI between 18.5 and 24.9.  · Overweight: BMI between 25 and 29.9.  · Obese: BMI of 30 and above.  Please note:  · Weight includes both fat and muscle, so someone with a muscular build, such as an athlete, may have a BMI that is higher than 24.9. In cases like these, BMI is not an accurate measure of body fat.  · To determine if excess body fat is the cause of a BMI of 25 or higher, further assessments may need to be done by a health care provider.  · BMI is usually interpreted in the same way for men and women.  Why is BMI a useful tool?  BMI is useful in two ways:  · Identifying a weight problem that may be related to a medical condition, or that may increase the risk for medical problems.  · Promoting lifestyle and diet changes in order to reach a healthy weight.  Summary  · Body mass index (BMI) is a number that is calculated from a person's weight and height.  · BMI may help to estimate how much of a person's weight is   composed of fat. BMI can help identify those who may be at higher risk for certain medical problems.  · BMI can be measured using English measurements or metric measurements.  · To interpret your results, your health care provider will use BMI charts to identify whether you are underweight, normal weight, overweight, or obese.  This information is not intended to replace advice given to you by your health care provider. Make sure you discuss any questions you have with your health care provider.  Document Released: 06/22/2004 Document Revised: 08/24/2017 Document Reviewed: 08/24/2017  Elsevier Interactive Patient Education © 2019 [employer]

## 2019-04-05 ENCOUNTER — Other Ambulatory Visit: Payer: Self-pay | Admitting: Nurse Practitioner

## 2019-04-05 DIAGNOSIS — I10 Essential (primary) hypertension: Secondary | ICD-10-CM

## 2019-04-09 ENCOUNTER — Telehealth: Payer: Self-pay

## 2019-04-09 NOTE — Telephone Encounter (Signed)
Ive attempted to call pt to reschedule his appointment due to [place] only seeing viturals on Tuesday afternoon I have been unable to leave a v/m the phone keeps beeping as if line is busy. YRL,RMA

## 2019-04-10 ENCOUNTER — Ambulatory Visit: Payer: Self-pay | Admitting: Nurse Practitioner

## 2019-04-12 ENCOUNTER — Ambulatory Visit: Payer: Self-pay | Admitting: Nurse Practitioner

## 2019-04-12 ENCOUNTER — Other Ambulatory Visit: Payer: Self-pay

## 2019-04-12 ENCOUNTER — Encounter: Payer: Self-pay | Admitting: Nurse Practitioner

## 2019-04-12 ENCOUNTER — Ambulatory Visit: Payer: 59 | Admitting: Nurse Practitioner

## 2019-04-12 VITALS — BP 124/88 | HR 73 | Temp 98.4°F | Ht 71.2 in | Wt 352.4 lb

## 2019-04-12 DIAGNOSIS — R252 Cramp and spasm: Secondary | ICD-10-CM | POA: Diagnosis not present

## 2019-04-12 DIAGNOSIS — M79605 Pain in left leg: Secondary | ICD-10-CM | POA: Diagnosis not present

## 2019-04-12 MED ORDER — CYCLOBENZAPRINE HCL 10 MG PO TABS: 10.0000 mg | ORAL_TABLET | Freq: Three times a day (TID) | ORAL | 0 refills | Status: DC | PRN

## 2019-04-12 NOTE — Progress Notes (Signed)
Subjective:     Patient ID: [name] [name] , male    DOB: 12-Nov-1980 , 38 y.o.   MRN: [id number]   Chief Complaint  Patient presents with  . Back Pain    patient states he was having back pain that is moving different places    HPI   He is describing pain going down his left leg (that feels like a muscle tightness).  He is drinking water regularly.    Back Pain This is a new problem. The current episode started in the past 7 days. The problem occurs intermittently. The problem has been gradually worsening since onset. The quality of the pain is described as aching. Radiates to: moves around his back. The pain is the same all the time. Stiffness is present all day. Associated symptoms include abdominal pain. Pertinent negatives include no chest pain, dysuria, leg pain or numbness. Risk factors include sedentary lifestyle and obesity. He has tried heat for the symptoms. The treatment provided mild relief.     Past Medical History:  Diagnosis Date  . Benign hypertension   . Morbid obesity ([place])      No family history on file.   Current Outpatient Medications:  .  olmesartan-hydrochlorothiazide (BENICAR HCT) 20-12.5 MG tablet, TAKE 1 TABLET EACH DAY., Disp: 30 tablet, Rfl: 0   No Known Allergies   Review of Systems  Cardiovascular: Negative.  Negative for chest pain, palpitations and leg swelling.  Gastrointestinal: Positive for abdominal pain.  Genitourinary: Negative for dysuria.  Musculoskeletal: Positive for back pain.  Neurological: Negative for dizziness, facial asymmetry and numbness.     Today's Vitals   04/12/19 1002  BP: 124/88  Pulse: 73  Temp: 98.4 F (36.9 C)  TempSrc: Oral  Weight: (!) 352 lb 6.4 oz (159.8 kg)  Height: 5' 11.2" (1.808 m)  PainSc: 2   PainLoc: Back   Body mass index is 48.87 kg/m.   Objective:  Physical Exam Vitals signs reviewed.  Constitutional:      Appearance: Normal appearance.  Cardiovascular:     Rate and Rhythm: Normal  rate and regular rhythm.     Heart sounds: No murmur.  Skin:    General: Skin is warm and dry.     Capillary Refill: Capillary refill takes less than 2 seconds.  Neurological:     General: No focal deficit present.     Mental Status: He is alert and oriented to person, place, and time.  Psychiatric:        Mood and Affect: Mood normal.        Behavior: Behavior normal.        Thought Content: Thought content normal.        Judgment: Judgment normal.         Assessment And Plan:     1. Muscle cramping  He does have tense muscles to his thigh and back area  Negative straight leg raise  Will treat with a muscle relaxer he is advised to not take when driving or operating heavy machinery  I have advised him to take Magnesium 250 mg with evening meal - CK, total - Magnesium - cyclobenzaprine (FLEXERIL) 10 MG tablet; Take 1 tablet (10 mg total) by mouth 3 (three) times daily as needed for muscle spasms.  Dispense: 30 tablet; Refill: 0 - CBC no Diff  2. Pain of left lower extremity  Muscle tension noted to left lower leg  Minette Brine, FNP    THE PATIENT IS ENCOURAGED TO PRACTICE  SOCIAL DISTANCING DUE TO THE COVID-19 PANDEMIC.

## 2019-04-12 NOTE — Patient Instructions (Signed)
   Take Magnesium 250 mg with evening meal for muscle cramps  Make sure to stay well hydrated with water daily.

## 2019-04-13 LAB — CBC
Hematocrit: 41.5 % (ref 37.5–51.0)
Hemoglobin: 13.8 g/dL (ref 13.0–17.7)
MCH: 27.9 pg (ref 26.6–33.0)
MCHC: 33.3 g/dL (ref 31.5–35.7)
MCV: 84 fL (ref 79–97)
Platelets: 220 10*3/uL (ref 150–450)
RBC: 4.95 x10E6/uL (ref 4.14–5.80)
RDW: 12.9 % (ref 11.6–15.4)
WBC: 6.3 10*3/uL (ref 3.4–10.8)

## 2019-04-13 LAB — CK: Total CK: 625 U/L (ref 49–439)

## 2019-04-13 LAB — MAGNESIUM: Magnesium: 2.3 mg/dL (ref 1.6–2.3)

## 2019-04-16 NOTE — Progress Notes (Signed)
I want him to take CoQ 10 which is over the counter daily, increase his water intake and we will recheck the levels in 1 week.  He just needs a lab visit scheduled

## 2019-04-25 ENCOUNTER — Other Ambulatory Visit: Payer: Self-pay

## 2019-04-25 ENCOUNTER — Other Ambulatory Visit: Payer: 59

## 2019-04-25 ENCOUNTER — Other Ambulatory Visit: Payer: Self-pay | Admitting: Nurse Practitioner

## 2019-04-25 DIAGNOSIS — R748 Abnormal levels of other serum enzymes: Secondary | ICD-10-CM

## 2019-04-25 DIAGNOSIS — R252 Cramp and spasm: Secondary | ICD-10-CM

## 2019-04-26 LAB — MAGNESIUM: Magnesium: 2.1 mg/dL (ref 1.6–2.3)

## 2019-04-26 LAB — BMP8+EGFR
BUN/Creatinine Ratio: 8 — ABNORMAL LOW (ref 9–20)
BUN: 9 mg/dL (ref 6–24)
CO2: 24 mmol/L (ref 20–29)
Calcium: 9.6 mg/dL (ref 8.7–10.2)
Chloride: 100 mmol/L (ref 96–106)
Creatinine, Ser: 1.09 mg/dL (ref 0.76–1.27)
GFR calc Af Amer: 98 mL/min/{1.73_m2} (ref >59)
GFR calc non Af Amer: 84 mL/min/{1.73_m2} (ref >59)
Glucose: 84 mg/dL (ref 65–99)
Potassium: 3.5 mmol/L (ref 3.5–5.2)
Sodium: 138 mmol/L (ref 134–144)

## 2019-04-26 LAB — CK: Total CK: 728 U/L (ref 49–439)

## 2019-05-21 ENCOUNTER — Ambulatory Visit: Payer: 59 | Admitting: Adult Health

## 2019-06-25 ENCOUNTER — Ambulatory Visit: Payer: 59 | Admitting: Nurse Practitioner

## 2019-06-25 ENCOUNTER — Encounter: Payer: Self-pay | Admitting: Nurse Practitioner

## 2019-06-25 ENCOUNTER — Other Ambulatory Visit: Payer: Self-pay

## 2019-06-25 VITALS — BP 132/78 | HR 66 | Temp 98.3°F | Ht 71.0 in | Wt 345.2 lb

## 2019-06-25 DIAGNOSIS — Z125 Encounter for screening for malignant neoplasm of prostate: Secondary | ICD-10-CM | POA: Diagnosis not present

## 2019-06-25 DIAGNOSIS — Z23 Encounter for immunization: Secondary | ICD-10-CM

## 2019-06-25 DIAGNOSIS — Z0000 Encounter for general adult medical examination without abnormal findings: Secondary | ICD-10-CM

## 2019-06-25 DIAGNOSIS — I10 Essential (primary) hypertension: Secondary | ICD-10-CM | POA: Diagnosis not present

## 2019-06-25 DIAGNOSIS — Z6841 Body Mass Index (BMI) 40.0 and over, adult: Secondary | ICD-10-CM

## 2019-06-25 DIAGNOSIS — R748 Abnormal levels of other serum enzymes: Secondary | ICD-10-CM | POA: Insufficient documentation

## 2019-06-25 LAB — POCT UA - MICROALBUMIN
Albumin/Creatinine Ratio, Urine, POC: <30
Creatinine, POC: 300 mg/dL
Microalbumin Ur, POC: 30 mg/L

## 2019-06-25 LAB — POCT URINALYSIS DIPSTICK
Bilirubin, UA: NEGATIVE
Blood, UA: NEGATIVE
Glucose, UA: NEGATIVE
Ketones, UA: NEGATIVE
Leukocytes, UA: NEGATIVE
Nitrite, UA: NEGATIVE
Protein, UA: NEGATIVE
Spec Grav, UA: 1.025 (ref 1.010–1.025)
Urobilinogen, UA: 1 E.U./dL
pH, UA: 6 (ref 5.0–8.0)

## 2019-06-25 MED ORDER — TETANUS-DIPHTH-ACELL PERTUSSIS 5-2.5-18.5 LF-MCG/0.5 IM SUSP
0.5000 mL | Freq: Once | INTRAMUSCULAR | Status: AC
  Administered 2019-06-25: 12:00:00 0.5 mL via INTRAMUSCULAR

## 2019-06-25 NOTE — Patient Instructions (Signed)
Health Maintenance  Topic Date Due  . HIV Screening  03/14/1994  . TETANUS/TDAP  06/24/2029  . INFLUENZA VACCINE  Completed   Health Maintenance, Male Adopting a healthy lifestyle and getting preventive care are important in promoting health and wellness. Ask your health care provider about:  The right schedule for you to have regular tests and exams.  Things you can do on your own to prevent diseases and keep yourself healthy. What should I know about diet, weight, and exercise? Eat a healthy diet   Eat a diet that includes plenty of vegetables, fruits, low-fat dairy products, and lean protein.  Do not eat a lot of foods that are high in solid fats, added sugars, or sodium. Maintain a healthy weight Body mass index (BMI) is a measurement that can be used to identify possible weight problems. It estimates body fat based on height and weight. Your health care provider can help determine your BMI and help you achieve or maintain a healthy weight. Get regular exercise Get regular exercise. This is one of the most important things you can do for your health. Most adults should:  Exercise for at least 150 minutes each week. The exercise should increase your heart rate and make you sweat (moderate-intensity exercise).  Do strengthening exercises at least twice a week. This is in addition to the moderate-intensity exercise.  Spend less time sitting. Even light physical activity can be beneficial. Watch cholesterol and blood lipids Have your blood tested for lipids and cholesterol at 40 years of age, then have this test every 5 years. You may need to have your cholesterol levels checked more often if:  Your lipid or cholesterol levels are high.  You are older than 40 years of age.  You are at high risk for heart disease. What should I know about cancer screening? Many types of cancers can be detected early and may often be prevented. Depending on your health history and family history,  you may need to have cancer screening at various ages. This may include screening for:  Colorectal cancer.  Prostate cancer.  Skin cancer.  Lung cancer. What should I know about heart disease, diabetes, and high blood pressure? Blood pressure and heart disease  High blood pressure causes heart disease and increases the risk of stroke. This is more likely to develop in people who have high blood pressure readings, are of African descent, or are overweight.  Talk with your health care provider about your target blood pressure readings.  Have your blood pressure checked: ? Every 3-5 years if you are 50-23 years of age. ? Every year if you are 22 years old or older.  If you are between the ages of 1 and 30 and are a current or former smoker, ask your health care provider if you should have a one-time screening for abdominal aortic aneurysm (AAA). Diabetes Have regular diabetes screenings. This checks your fasting blood sugar level. Have the screening done:  Once every three years after age 18 if you are at a normal weight and have a low risk for diabetes.  More often and at a younger age if you are overweight or have a high risk for diabetes. What should I know about preventing infection? Hepatitis B If you have a higher risk for hepatitis B, you should be screened for this virus. Talk with your health care provider to find out if you are at risk for hepatitis B infection. Hepatitis C Blood testing is recommended for:  Everyone  born from [birth year] through 1967.  Anyone with known risk factors for hepatitis C. Sexually transmitted infections (STIs)  You should be screened each year for STIs, including gonorrhea and chlamydia, if: ? You are sexually active and are younger than 40 years of age. ? You are older than 40 years of age and your health care provider tells you that you are at risk for this type of infection. ? Your sexual activity has changed since you were last screened, and  you are at increased risk for chlamydia or gonorrhea. Ask your health care provider if you are at risk.  Ask your health care provider about whether you are at high risk for HIV. Your health care provider may recommend a prescription medicine to help prevent HIV infection. If you choose to take medicine to prevent HIV, you should first get tested for HIV. You should then be tested every 3 months for as long as you are taking the medicine. Follow these instructions at home: Lifestyle  Do not use any products that contain nicotine or tobacco, such as cigarettes, e-cigarettes, and chewing tobacco. If you need help quitting, ask your health care provider.  Do not use street drugs.  Do not share needles.  Ask your health care provider for help if you need support or information about quitting drugs. Alcohol use  Do not drink alcohol if your health care provider tells you not to drink.  If you drink alcohol: ? Limit how much you have to 0-2 drinks a day. ? Be aware of how much alcohol is in your drink. In the [place] one drink equals one 12 oz bottle of beer (355 mL), one 5 oz glass of wine (148 mL), or one 1 oz glass of hard liquor (44 mL). General instructions  Schedule regular health, dental, and eye exams.  Stay current with your vaccines.  Tell your health care provider if: ? You often feel depressed. ? You have ever been abused or do not feel safe at home. Summary  Adopting a healthy lifestyle and getting preventive care are important in promoting health and wellness.  Follow your health care provider's instructions about healthy diet, exercising, and getting tested or screened for diseases.  Follow your health care provider's instructions on monitoring your cholesterol and blood pressure. This information is not intended to replace advice given to you by your health care provider. Make sure you discuss any questions you have with your health care provider. Document Released:  04/08/2008 Document Revised: 10/04/2018 Document Reviewed: 10/04/2018 Elsevier Patient Education  2020 [employer].

## 2019-06-25 NOTE — Progress Notes (Signed)
Subjective:     Patient ID: [name] [name] , male    DOB: [birth year] , 38 y.o.   MRN: [id number]   Chief Complaint  Patient presents with  . Annual Exam   Men's preventive visit. Patient Health Questionnaire (PHQ-2) is    Office Visit from 06/25/2019 in [facility]  PHQ-2 Total Score  0     Patient is on a regular diet, he has changed his portion sizes. Marital status: Single. Relevant history for alcohol use is:  Social History   Substance and Sexual Activity  Alcohol Use None   Relevant history for tobacco use is:  Social History   Tobacco Use  Smoking Status Never Smoker  Smokeless Tobacco Never Used   He is exercising by cutting his grass at home.   HPI  Here for HM  Wt Readings from Last 3 Encounters: 06/25/19 : (!) 345 lb 3.2 oz (156.6 kg) 04/12/19 : (!) 352 lb 6.4 oz (159.8 kg) 03/22/19 : (!) 356 lb (161.5 kg)   Hypertension This is a chronic problem. The current episode started more than 1 year ago. The problem is unchanged. The problem is controlled. Pertinent negatives include no anxiety, chest pain or palpitations. There are no associated agents to hypertension. Risk factors for coronary artery disease include obesity, sedentary lifestyle and male gender. Past treatments include diuretics and angiotensin blockers. The current treatment provides moderate improvement. There are no compliance problems.  There is no history of angina. Identifiable causes of hypertension include chronic renal disease.     Past Medical History:  Diagnosis Date  . Benign hypertension   . Morbid obesity ([place])      Family History  Problem Relation Age of Onset  . Hypertension Mother   . Hypertension Father      Current Outpatient Medications:  .  olmesartan-hydrochlorothiazide (BENICAR HCT) 20-12.5 MG tablet, TAKE 1 TABLET EACH DAY., Disp: 30 tablet, Rfl: 0 .  cyclobenzaprine (FLEXERIL) 10 MG tablet, Take 1 tablet (10 mg total) by mouth 3 (three) times  daily as needed for muscle spasms. (Patient not taking: Reported on 06/25/2019), Disp: 30 tablet, Rfl: 0   No Known Allergies   Review of Systems  Constitutional: Negative.   HENT: Negative.   Eyes: Negative.   Respiratory: Negative.   Cardiovascular: Negative for chest pain, palpitations and leg swelling.  Gastrointestinal: Negative.   Endocrine: Negative.   Genitourinary: Negative.   Musculoskeletal: Negative.   Skin: Negative.   Allergic/Immunologic: Negative.   Neurological: Negative.   Hematological: Negative.   Psychiatric/Behavioral: Negative.   All other systems reviewed and are negative.    Today's Vitals   06/25/19 1050  BP: 132/78  Pulse: 66  Temp: 98.3 F (36.8 C)  TempSrc: Oral  SpO2: 96%  Weight: (!) 345 lb 3.2 oz (156.6 kg)  Height: 5' 11"  (1.803 m)   Body mass index is 48.15 kg/m.   Objective:  Physical Exam Vitals signs reviewed.  Constitutional:      Appearance: Normal appearance.  HENT:     Right Ear: Tympanic membrane normal.     Left Ear: Ear canal normal.     Nose: Nose normal. No congestion.     Mouth/Throat:     Mouth: Mucous membranes are moist.  Eyes:     Extraocular Movements: Extraocular movements intact.     Pupils: Pupils are equal, round, and reactive to light.  Neck:     Musculoskeletal: Normal range of motion.  Cardiovascular:  Rate and Rhythm: Normal rate and regular rhythm.     Pulses: Normal pulses.     Heart sounds: Normal heart sounds. No murmur.  Pulmonary:     Effort: Pulmonary effort is normal.     Breath sounds: Normal breath sounds.  Abdominal:     General: Abdomen is flat. Bowel sounds are normal.     Palpations: Abdomen is soft.  Skin:    General: Skin is warm and dry.     Capillary Refill: Capillary refill takes less than 2 seconds.  Neurological:     General: No focal deficit present.     Mental Status: He is alert and oriented to person, place, and time.  Psychiatric:        Mood and Affect: Mood  normal.        Behavior: Behavior normal.        Thought Content: Thought content normal.        Judgment: Judgment normal.         Assessment And Plan:     1. Health maintenance examination . Behavior modifications discussed and diet history reviewed.   . Pt will continue to exercise regularly and modify diet with low GI, plant based foods and decrease intake of processed foods.  . Recommend intake of daily multivitamin, Vitamin D, and calcium.  . Recommend for preventive screenings, as well as recommend immunizations that include influenza, TDAP, and Shingles  2. Encounter for prostate cancer screening  - PSA  3. Morbid obesity with BMI of 45.0-49.9, adult (HCC)  Chronic  Discussed healthy diet and regular exercise options   Encouraged to exercise at least 150 minutes per week with 2 days of strength training  Congratulated on his 11 lb weight loss. - Hemoglobin A1c  4. Encounter for immunization  Tetanus vaccine given today while in office. TDAP will be administered to adults 40-70 years old every 10 years. - Tdap (BOOSTRIX) injection 0.5 mL  5. Need for influenza vaccination  Influenza vaccine given in office  Advised to take Tylenol as needed for muscle aches or fever - Flu Vaccine QUAD 6+ mos PF IM (Fluarix Quad PF)  6. Essential hypertension  Chronic  Blood pressure is controlled  Continue with current medications  EKG no changes from previous  - EKG 12-Lead - BMP8+eGFR  7. Elevated CK  Since taking CoQ10 his muscle cramps have improved.  Will recheck CK levels today - CK, total   Minette Brine, FNP    THE PATIENT IS ENCOURAGED TO PRACTICE SOCIAL DISTANCING DUE TO THE COVID-19 PANDEMIC.

## 2019-06-26 LAB — BMP8+EGFR
BUN/Creatinine Ratio: 10 (ref 9–20)
BUN: 10 mg/dL (ref 6–24)
CO2: 22 mmol/L (ref 20–29)
Calcium: 9.4 mg/dL (ref 8.7–10.2)
Chloride: 104 mmol/L (ref 96–106)
Creatinine, Ser: 0.99 mg/dL (ref 0.76–1.27)
GFR calc Af Amer: 110 mL/min/{1.73_m2} (ref >59)
GFR calc non Af Amer: 95 mL/min/{1.73_m2} (ref >59)
Glucose: 104 mg/dL — ABNORMAL HIGH (ref 65–99)
Potassium: 3.8 mmol/L (ref 3.5–5.2)
Sodium: 140 mmol/L (ref 134–144)

## 2019-06-26 LAB — PSA: Prostate Specific Ag, Serum: 0.8 ng/mL (ref 0.0–4.0)

## 2019-06-26 LAB — HEMOGLOBIN A1C
Est. average glucose Bld gHb Est-mCnc: 128 mg/dL
Hgb A1c MFr Bld: 6.1 % — ABNORMAL HIGH (ref 4.8–5.6)

## 2019-06-26 LAB — CK: Total CK: 484 U/L — ABNORMAL HIGH (ref 49–439)

## 2019-07-18 ENCOUNTER — Other Ambulatory Visit: Payer: Self-pay | Admitting: Nurse Practitioner

## 2019-07-18 DIAGNOSIS — I10 Essential (primary) hypertension: Secondary | ICD-10-CM

## 2019-08-21 ENCOUNTER — Other Ambulatory Visit: Payer: Self-pay | Admitting: Nurse Practitioner

## 2019-08-21 DIAGNOSIS — I10 Essential (primary) hypertension: Secondary | ICD-10-CM

## 2019-09-04 ENCOUNTER — Ambulatory Visit (INDEPENDENT_AMBULATORY_CARE_PROVIDER_SITE_OTHER): Payer: 59 | Admitting: Adult Health

## 2019-09-04 ENCOUNTER — Encounter: Payer: Self-pay | Admitting: Adult Health

## 2019-09-04 ENCOUNTER — Other Ambulatory Visit: Payer: Self-pay

## 2019-09-04 VITALS — BP 113/70 | HR 65 | Temp 97.5°F | Ht 71.0 in | Wt 340.8 lb

## 2019-09-04 DIAGNOSIS — Z9989 Dependence on other enabling machines and devices: Secondary | ICD-10-CM | POA: Diagnosis not present

## 2019-09-04 DIAGNOSIS — G4733 Obstructive sleep apnea (adult) (pediatric): Secondary | ICD-10-CM | POA: Diagnosis not present

## 2019-09-04 NOTE — Progress Notes (Addendum)
PATIENT: [name] [name] [name] DOB: 11/29/1980  REASON FOR VISIT: follow up HISTORY FROM: patient  HISTORY OF PRESENT ILLNESS: Today 09/04/19:  Mr. [name] [name] is a 38 year old male with a history of obstructive sleep apnea on CPAP.  His download indicates that he uses machine 21 days for compliance of 70%.  He uses machine greater than 4 hours only 33%.  He reports that his mask is too big.  He states that it tends to come off during the night.  On average he uses his machine 2 hours and 24 minutes.  His residual AHI is 2.6 on 11 cm of water.  He returns today for an evaluation.  HISTORY 09/19/2018: I reviewed his CPAP compliance data from 08/19/2018 through 09/17/2018 which is a total of 30 days, during which time he used his device 29 days with percent used days greater than 4 hours at 76.7%, indicating adequate compliance with an average usage of 4 hours and 47 minutes, residual AHI at goal at 1.3 per hour, leak on the higher end, CPAP pressure of 11 cm. He reports doing fairly well with his CPAP, he tries to put it on consistently but sometimes does fall asleep without it. He has noticed some improvement in his ability to breathe at night, otherwise no telltale changes in his sleep quality or sleep consolidation.  REVIEW OF SYSTEMS: Out of a complete 14 system review of symptoms, the patient complains only of the following symptoms, and all other reviewed systems are negative.  Epworth sleepiness score 2 fatigue severity score 10  ALLERGIES: Allergies  Allergen Reactions  . Fish Allergy Anaphylaxis    HOME MEDICATIONS: Outpatient Medications Prior to Visit  Medication Sig Dispense Refill  . olmesartan-hydrochlorothiazide (BENICAR HCT) 20-12.5 MG tablet TAKE 1 TABLET ONCE DAILY. 30 tablet 0  . cyclobenzaprine (FLEXERIL) 10 MG tablet Take 1 tablet (10 mg total) by mouth 3 (three) times daily as needed for muscle spasms. (Patient not taking: Reported on 06/25/2019) 30 tablet 0   No  facility-administered medications prior to visit.     PAST MEDICAL HISTORY: Past Medical History:  Diagnosis Date  . Benign hypertension   . Morbid obesity ([place])     PAST SURGICAL HISTORY: History reviewed. No pertinent surgical history.  FAMILY HISTORY: Family History  Problem Relation Age of Onset  . Hypertension Mother   . Hypertension Father     SOCIAL HISTORY: Social History   Socioeconomic History  . Marital status: Single    Spouse name: Not on file  . Number of children: Not on file  . Years of education: Not on file  . Highest education level: Not on file  Occupational History  . Not on file  Social Needs  . Financial resource strain: Not on file  . Food insecurity    Worry: Not on file    Inability: Not on file  . Transportation needs    Medical: Not on file    Non-medical: Not on file  Tobacco Use  . Smoking status: Never Smoker  . Smokeless tobacco: Never Used  Substance and Sexual Activity  . Alcohol use: Not on file  . Drug use: Never  . Sexual activity: Not on file  Lifestyle  . Physical activity    Days per week: Not on file    Minutes per session: Not on file  . Stress: Not on file  Relationships  . Social Herbalist on phone: Not on file    Gets together: Not  on file    Attends religious service: Not on file    Active member of club or organization: Not on file    Attends meetings of clubs or organizations: Not on file    Relationship status: Not on file  . Intimate partner violence    Fear of current or ex partner: Not on file    Emotionally abused: Not on file    Physically abused: Not on file    Forced sexual activity: Not on file  Other Topics Concern  . Not on file  Social History Narrative  . Not on file      PHYSICAL EXAM  Vitals:   09/04/19 0742  BP: 113/70  Pulse: 65  Temp: (!) 97.5 F (36.4 C)  TempSrc: Oral  Weight: (!) 340 lb 12.8 oz (154.6 kg)  Height: 5\' 11"  (1.803 m)   Body mass index is  47.53 kg/m.   Generalized: Well developed, in no acute distress  Chest: Lungs clear to auscultation bilaterally  Neurological examination  Mentation: Alert oriented to time, place, history taking. Follows all commands speech and language fluent Cranial nerve II-XII: Extraocular movements were full, visual field were full on confrontational test Head turning and shoulder shrug  were normal and symmetric. Motor: The motor testing reveals 5 over 5 strength of all 4 extremities. Good symmetric motor tone is noted throughout.  Sensory: Sensory testing is intact to soft touch on all 4 extremities. No evidence of extinction is noted.  Gait and station: Gait is normal.    DIAGNOSTIC DATA (LABS, IMAGING, TESTING) - I reviewed patient records, labs, notes, testing and imaging myself where available.  Lab Results  Component Value Date   WBC 6.3 04/12/2019   HGB 13.8 04/12/2019   HCT 41.5 04/12/2019   MCV 84 04/12/2019   PLT 220 04/12/2019      Component Value Date/Time   NA 140 06/25/2019 1207   K 3.8 06/25/2019 1207   CL 104 06/25/2019 1207   CO2 22 06/25/2019 1207   GLUCOSE 104 (H) 06/25/2019 1207   BUN 10 06/25/2019 1207   CREATININE 0.99 06/25/2019 1207   CALCIUM 9.4 06/25/2019 1207   GFRNONAA 95 06/25/2019 1207   GFRAA 110 06/25/2019 1207      ASSESSMENT AND PLAN 38 y.o. year old male  has a past medical history of Benign hypertension and Morbid obesity (HCC). here with :  1. Obstructive sleep apnea on CPAP  The patient's CPAP download shows suboptimal compliance.  The patient has a large leak which is affecting his usage.  He will be sent for mask refitting.  He is encouraged to continue using CPAP nightly and greater than 4 hours each night.  He is advised that if his symptoms worsen or he develops new symptoms he should let 41 know.  He will follow-up in 1 year or sooner if needed    I spent 15 minutes with the patient. 50% of this time was spent reviewing CPAP download    [place], MSN, NP-C 09/04/2019, 8:05 AM [facility] [address] [place], [place] [phone]  I reviewed the above note and documentation by the Nurse Practitioner and agree with the history, exam, assessment and plan as outlined above. I was available for consultation. [phone], MD, PhD [facility])

## 2019-09-04 NOTE — Progress Notes (Signed)
[facility] sent CM about new cpap orders.  Received. sy

## 2019-09-04 NOTE — Patient Instructions (Signed)
Continue using CPAP nightly and greater than 4 hours each night Mask refitting If your symptoms worsen or you develop new symptoms please let us know.   Please call [facility] at [phone], and press option 1 when prompted. Their customer service representatives will be glad to assist you. If they are unable to answer, please leave a message and they will call you back. Make sure to leave your name and return phone number.  

## 2019-09-10 ENCOUNTER — Telehealth: Payer: Self-pay

## 2019-09-10 NOTE — Telephone Encounter (Signed)
Patient called stating his bp has been going up and down in the last couple of days. I returned pt call and moved his appt to this Wednesday and advised him to only check his bp daily. YRL,RMA

## 2019-09-12 ENCOUNTER — Other Ambulatory Visit: Payer: Self-pay

## 2019-09-12 ENCOUNTER — Encounter: Payer: Self-pay | Admitting: Nurse Practitioner

## 2019-09-12 ENCOUNTER — Ambulatory Visit: Payer: 59 | Admitting: Nurse Practitioner

## 2019-09-12 VITALS — BP 130/88 | HR 71 | Temp 98.2°F | Ht 70.8 in | Wt 335.8 lb

## 2019-09-12 DIAGNOSIS — I10 Essential (primary) hypertension: Secondary | ICD-10-CM | POA: Diagnosis not present

## 2019-09-12 DIAGNOSIS — Z6841 Body Mass Index (BMI) 40.0 and over, adult: Secondary | ICD-10-CM

## 2019-09-12 DIAGNOSIS — R748 Abnormal levels of other serum enzymes: Secondary | ICD-10-CM | POA: Diagnosis not present

## 2019-09-12 NOTE — Progress Notes (Signed)
Subjective:     Patient ID: [name] [name] , male    DOB: 04-16-1981 , 38 y.o.   MRN: [id number]   Chief Complaint  Patient presents with  . Hypertension    patient stated his blood pressure has been flucuatiating     HPI  138/89 Tuesday, 126/94 this morning.  4 days ago he ate a pork chop when he woke up he said the room was spinning.  Was as high as 146/84.  6 bottles of water a day.    Wt Readings from Last 3 Encounters: 09/12/19 : (!) 335 lb 12.8 oz (152.3 kg) 09/04/19 : (!) 340 lb 12.8 oz (154.6 kg) 06/25/19 : (!) 345 lb 3.2 oz (156.6 kg)   Hypertension This is a chronic problem. The current episode started more than 1 year ago. Pertinent negatives include no chest pain, headaches or palpitations.     Past Medical History:  Diagnosis Date  . Benign hypertension   . Morbid obesity ([place])      Family History  Problem Relation Age of Onset  . Hypertension Mother   . Hypertension Father      Current Outpatient Medications:  .  olmesartan-hydrochlorothiazide (BENICAR HCT) 20-12.5 MG tablet, TAKE 1 TABLET ONCE DAILY., Disp: 30 tablet, Rfl: 0   Allergies  Allergen Reactions  . Fish Allergy Anaphylaxis     Review of Systems  Constitutional: Negative.   Respiratory: Negative.   Cardiovascular: Negative.  Negative for chest pain, palpitations and leg swelling.  Neurological: Negative for dizziness and headaches.     Today's Vitals   09/12/19 1542  BP: 130/88  Pulse: 71  Temp: 98.2 F (36.8 C)  TempSrc: Oral  Weight: (!) 335 lb 12.8 oz (152.3 kg)  Height: 5' 10.8" (1.798 m)  PainSc: 0-No pain   Body mass index is 47.1 kg/m.   Objective:  Physical Exam Vitals signs reviewed.  Constitutional:      General: He is not in acute distress.    Appearance: Normal appearance. He is obese.  Cardiovascular:     Rate and Rhythm: Normal rate and regular rhythm.     Pulses: Normal pulses.     Heart sounds: Normal heart sounds. No murmur.  Pulmonary:     Effort:  Pulmonary effort is normal. No respiratory distress.     Breath sounds: Normal breath sounds.  Skin:    General: Skin is warm and dry.     Capillary Refill: Capillary refill takes less than 2 seconds.  Neurological:     General: No focal deficit present.     Mental Status: He is alert and oriented to person, place, and time.  Psychiatric:        Mood and Affect: Mood normal.        Behavior: Behavior normal.        Thought Content: Thought content normal.        Judgment: Judgment normal.         Assessment And Plan:     1. Essential hypertension His blood pressure is better I continue to encourage him to avoid high salt foods - BMP8+eGFR  2. Morbid obesity with BMI of 45.0-49.9, adult (HCC)  Chronic  Discussed healthy diet and regular exercise options   Encouraged to exercise at least 150 minutes per week with 2 days of strength training  He has lost 10 lbs since August  3. Elevated CK  Will recheck to see if improved - CK, total   Minette Brine, FNP  THE PATIENT IS ENCOURAGED TO PRACTICE SOCIAL DISTANCING DUE TO THE COVID-19 PANDEMIC.   

## 2019-09-12 NOTE — Patient Instructions (Signed)

## 2019-09-13 LAB — BMP8+EGFR
BUN/Creatinine Ratio: 11 (ref 9–20)
BUN: 12 mg/dL (ref 6–24)
CO2: 25 mmol/L (ref 20–29)
Calcium: 9.8 mg/dL (ref 8.7–10.2)
Chloride: 102 mmol/L (ref 96–106)
Creatinine, Ser: 1.1 mg/dL (ref 0.76–1.27)
GFR calc Af Amer: 97 mL/min/{1.73_m2} (ref >59)
GFR calc non Af Amer: 84 mL/min/{1.73_m2} (ref >59)
Glucose: 87 mg/dL (ref 65–99)
Potassium: 4.1 mmol/L (ref 3.5–5.2)
Sodium: 140 mmol/L (ref 134–144)

## 2019-09-13 LAB — CK: Total CK: 381 U/L (ref 49–439)

## 2019-09-18 ENCOUNTER — Encounter: Payer: Self-pay | Admitting: Nurse Practitioner

## 2019-09-24 ENCOUNTER — Other Ambulatory Visit: Payer: Self-pay | Admitting: Nurse Practitioner

## 2019-09-24 ENCOUNTER — Ambulatory Visit: Payer: 59 | Admitting: Nurse Practitioner

## 2019-09-24 DIAGNOSIS — I10 Essential (primary) hypertension: Secondary | ICD-10-CM

## 2019-10-28 ENCOUNTER — Other Ambulatory Visit: Payer: Self-pay | Admitting: Nurse Practitioner

## 2019-10-28 DIAGNOSIS — I10 Essential (primary) hypertension: Secondary | ICD-10-CM

## 2019-11-27 ENCOUNTER — Other Ambulatory Visit: Payer: Self-pay | Admitting: Nurse Practitioner

## 2019-11-27 DIAGNOSIS — I10 Essential (primary) hypertension: Secondary | ICD-10-CM

## 2019-12-17 ENCOUNTER — Encounter: Payer: Self-pay | Admitting: Nurse Practitioner

## 2019-12-17 ENCOUNTER — Ambulatory Visit: Payer: 59 | Admitting: Nurse Practitioner

## 2019-12-17 ENCOUNTER — Other Ambulatory Visit: Payer: Self-pay

## 2019-12-17 VITALS — BP 122/80 | HR 67 | Temp 98.3°F | Ht 70.6 in | Wt 340.6 lb

## 2019-12-17 DIAGNOSIS — R7303 Prediabetes: Secondary | ICD-10-CM | POA: Diagnosis not present

## 2019-12-17 DIAGNOSIS — I10 Essential (primary) hypertension: Secondary | ICD-10-CM | POA: Diagnosis not present

## 2019-12-17 DIAGNOSIS — Z6841 Body Mass Index (BMI) 40.0 and over, adult: Secondary | ICD-10-CM

## 2019-12-17 DIAGNOSIS — Z113 Encounter for screening for infections with a predominantly sexual mode of transmission: Secondary | ICD-10-CM

## 2019-12-17 MED ORDER — OLMESARTAN MEDOXOMIL-HCTZ 20-12.5 MG PO TABS: 1.0000 | ORAL_TABLET | Freq: Every day | ORAL | 1 refills | Status: DC

## 2019-12-17 NOTE — Progress Notes (Signed)
Subjective:     Patient ID: [name] [name] , male    DOB: [birth year] , 39 y.o.   MRN: [id number]   Chief Complaint  Patient presents with  . Hypertension    HPI  He drinks 5-6 bottles of water a day.    Wt Readings from Last 3 Encounters: 12/17/19 : (!) 340 lb 9.6 oz (154.5 kg) 09/12/19 : (!) 335 lb 12.8 oz (152.3 kg) 09/04/19 : (!) 340 lb 12.8 oz (154.6 kg)   Hypertension This is a chronic problem. The current episode started more than 1 year ago. The problem has been gradually improving since onset. The problem is controlled. Pertinent negatives include no anxiety, chest pain, headaches or palpitations. There are no associated agents to hypertension. Risk factors for coronary artery disease include sedentary lifestyle, male gender and obesity. Past treatments include diuretics and angiotensin blockers. The current treatment provides moderate improvement. Compliance problems include exercise.  There is no history of angina. There is no history of chronic renal disease.     Past Medical History:  Diagnosis Date  . Benign hypertension   . Morbid obesity ([place])      Family History  Problem Relation Age of Onset  . Hypertension Mother   . Hypertension Father      Current Outpatient Medications:  .  olmesartan-hydrochlorothiazide (BENICAR HCT) 20-12.5 MG tablet, Take 1 tablet by mouth daily., Disp: 90 tablet, Rfl: 1   Allergies  Allergen Reactions  . Fish Allergy Anaphylaxis     Review of Systems  Constitutional: Negative.  Negative for fatigue.  Eyes: Negative for visual disturbance.  Respiratory: Negative.  Negative for cough.   Cardiovascular: Negative.  Negative for chest pain, palpitations and leg swelling.  Neurological: Negative for dizziness and headaches.  Psychiatric/Behavioral: Negative.      Today's Vitals   12/17/19 0851  BP: 122/80  Pulse: 67  Temp: 98.3 F (36.8 C)  TempSrc: Oral  Weight: (!) 340 lb 9.6 oz (154.5 kg)  Height: 5' 10.6" (1.793 m)   PainSc: 0-No pain   Body mass index is 48.04 kg/m.   Objective:  Physical Exam Vitals reviewed.  Constitutional:      General: He is not in acute distress.    Appearance: Normal appearance. He is obese.  Cardiovascular:     Rate and Rhythm: Normal rate and regular rhythm.     Pulses: Normal pulses.     Heart sounds: Normal heart sounds. No murmur.  Pulmonary:     Effort: Pulmonary effort is normal. No respiratory distress.     Breath sounds: Normal breath sounds.  Skin:    General: Skin is warm and dry.     Capillary Refill: Capillary refill takes less than 2 seconds.  Neurological:     General: No focal deficit present.     Mental Status: He is alert and oriented to person, place, and time.     Cranial Nerves: No cranial nerve deficit.  Psychiatric:        Mood and Affect: Mood normal.        Behavior: Behavior normal.        Thought Content: Thought content normal.        Judgment: Judgment normal.         Assessment And Plan:     1. Essential hypertension  .jm - olmesartan-hydrochlorothiazide (BENICAR HCT) 20-12.5 MG tablet; Take 1 tablet by mouth daily.  Dispense: 90 tablet; Refill: 1 - Lipid panel - Hemoglobin A1c - CMP14+EGFR  2. Morbid obesity with BMI of 45.0-49.9, adult (HCC)  Chronic, he has had a 5 lb weight gain since his last visit  Encouraged to incorporate more physical activity throughout his day by walking 5 minutes in the morning and 5 minutes in the evening. 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   Also, encouraged him to use a fitness tracker for his steps.  3. Prediabetes  Chronic, will recheck HgbA1c  Encouraged to avoid sugary foods and drinks, white processed foods as well.  - Lipid panel     Minette Brine, FNP    THE PATIENT IS ENCOURAGED TO PRACTICE SOCIAL DISTANCING DUE TO THE COVID-19 PANDEMIC.

## 2019-12-18 LAB — LIPID PANEL
Chol/HDL Ratio: 3.9 ratio (ref 0.0–5.0)
Cholesterol, Total: 181 mg/dL (ref 100–199)
HDL: 47 mg/dL (ref >39)
LDL Chol Calc (NIH): 121 mg/dL — ABNORMAL HIGH (ref 0–99)
Triglycerides: 71 mg/dL (ref 0–149)
VLDL Cholesterol Cal: 13 mg/dL (ref 5–40)

## 2019-12-18 LAB — HIV ANTIBODY (ROUTINE TESTING W REFLEX): HIV Screen 4th Generation wRfx: NONREACTIVE

## 2019-12-18 LAB — BMP8+EGFR
BUN/Creatinine Ratio: 12 (ref 9–20)
BUN: 13 mg/dL (ref 6–24)
CO2: 22 mmol/L (ref 20–29)
Calcium: 9.5 mg/dL (ref 8.7–10.2)
Chloride: 102 mmol/L (ref 96–106)
Creatinine, Ser: 1.06 mg/dL (ref 0.76–1.27)
GFR calc Af Amer: 101 mL/min/{1.73_m2} (ref >59)
GFR calc non Af Amer: 87 mL/min/{1.73_m2} (ref >59)
Glucose: 98 mg/dL (ref 65–99)
Potassium: 4.3 mmol/L (ref 3.5–5.2)
Sodium: 140 mmol/L (ref 134–144)

## 2019-12-18 LAB — HEMOGLOBIN A1C
Est. average glucose Bld gHb Est-mCnc: 128 mg/dL
Hgb A1c MFr Bld: 6.1 % — ABNORMAL HIGH (ref 4.8–5.6)

## 2020-06-26 ENCOUNTER — Encounter: Payer: Self-pay | Admitting: Nurse Practitioner

## 2020-06-26 ENCOUNTER — Other Ambulatory Visit: Payer: Self-pay

## 2020-06-26 ENCOUNTER — Ambulatory Visit (INDEPENDENT_AMBULATORY_CARE_PROVIDER_SITE_OTHER): Payer: 59 | Admitting: Nurse Practitioner

## 2020-06-26 VITALS — BP 130/82 | HR 75 | Temp 98.2°F | Ht 71.4 in | Wt 332.4 lb

## 2020-06-26 DIAGNOSIS — Z1159 Encounter for screening for other viral diseases: Secondary | ICD-10-CM

## 2020-06-26 DIAGNOSIS — R7303 Prediabetes: Secondary | ICD-10-CM

## 2020-06-26 DIAGNOSIS — Z23 Encounter for immunization: Secondary | ICD-10-CM

## 2020-06-26 DIAGNOSIS — Z0000 Encounter for general adult medical examination without abnormal findings: Secondary | ICD-10-CM | POA: Diagnosis not present

## 2020-06-26 DIAGNOSIS — Z125 Encounter for screening for malignant neoplasm of prostate: Secondary | ICD-10-CM | POA: Diagnosis not present

## 2020-06-26 DIAGNOSIS — G4730 Sleep apnea, unspecified: Secondary | ICD-10-CM | POA: Diagnosis not present

## 2020-06-26 DIAGNOSIS — I10 Essential (primary) hypertension: Secondary | ICD-10-CM

## 2020-06-26 DIAGNOSIS — Z6841 Body Mass Index (BMI) 40.0 and over, adult: Secondary | ICD-10-CM

## 2020-06-26 LAB — POCT URINALYSIS DIPSTICK
Bilirubin, UA: NEGATIVE
Blood, UA: NEGATIVE
Glucose, UA: NEGATIVE
Ketones, UA: NEGATIVE
Leukocytes, UA: NEGATIVE
Nitrite, UA: NEGATIVE
Protein, UA: NEGATIVE
Spec Grav, UA: >=1.03 — AB (ref 1.010–1.025)
Urobilinogen, UA: 4 E.U./dL — AB
pH, UA: 7 (ref 5.0–8.0)

## 2020-06-26 LAB — POCT UA - MICROALBUMIN
Albumin/Creatinine Ratio, Urine, POC: 30
Creatinine, POC: 200 mg/dL
Microalbumin Ur, POC: 30 mg/L

## 2020-06-26 NOTE — Patient Instructions (Signed)

## 2020-06-26 NOTE — Progress Notes (Signed)
I,Yamilka Roman [employer] as a Education administrator for [employer], FNP.,have documented all relevant documentation on the behalf of Minette Brine, FNP,as directed by  Minette Brine, FNP while in the presence of Minette Brine, [place].  This visit occurred during the SARS-CoV-2 public health emergency.  Safety protocols were in place, including screening questions prior to the visit, additional usage of staff PPE, and extensive cleaning of exam room while observing appropriate contact time as indicated for disinfecting solutions.  Subjective:     Patient ID: [name] [name] , male    DOB: 1981/08/08 , 39 y.o.   MRN: [id number]   Chief Complaint  Patient presents with  . Annual Exam    HPI  Here for HM  Wt Readings from Last 3 Encounters: 06/26/20 : (!) 332 lb 6.4 oz (150.8 kg) 12/17/19 : (!) 340 lb 9.6 oz (154.5 kg) 09/12/19 : (!) 335 lb 12.8 oz (152.3 kg)  He has been exercising more.  He is driving a bigger truck.  Continues to stay well hydrated - will go through a case of water in 4 days.    Hypertension This is a chronic problem. The current episode started more than 1 year ago. The problem has been gradually improving since onset. The problem is controlled. Pertinent negatives include no anxiety, chest pain, headaches or palpitations. There are no associated agents to hypertension. Risk factors for coronary artery disease include sedentary lifestyle, male gender and obesity. Past treatments include diuretics and angiotensin blockers. The current treatment provides moderate improvement. Compliance problems include exercise.  There is no history of angina or kidney disease. There is no history of chronic renal disease.     Past Medical History:  Diagnosis Date  . Benign hypertension   . Morbid obesity ([place])      Family History  Problem Relation Age of Onset  . Hypertension Mother   . Hypertension Father      Current Outpatient Medications:  .  olmesartan-hydrochlorothiazide (BENICAR  HCT) 20-12.5 MG tablet, Take 1 tablet by mouth daily., Disp: 90 tablet, Rfl: 1   Allergies  Allergen Reactions  . Fish Allergy Anaphylaxis     Men's preventive visit. Patient Health Questionnaire (PHQ-2) is    Office Visit from 12/17/2019 in [facility]  PHQ-2 Total Score 0     Patient is on a Regular diet, limits amount of pork. Low salt diet. Exercising with a strenuous job. Marital status: Single. Relevant history for alcohol use is:  Social History   Substance and Sexual Activity  Alcohol Use None   Relevant history for tobacco use is:  Social History   Tobacco Use  Smoking Status Never Smoker  Smokeless Tobacco Never Used  .   Review of Systems  Constitutional: Negative.   HENT: Negative.   Eyes: Negative.   Respiratory: Negative.   Cardiovascular: Negative.  Negative for chest pain and palpitations.  Gastrointestinal: Negative.   Endocrine: Negative.   Genitourinary: Negative.   Musculoskeletal: Negative.   Skin: Negative.   Allergic/Immunologic: Negative.   Neurological: Negative.  Negative for headaches.  Hematological: Negative.   Psychiatric/Behavioral: Negative.      Today's Vitals   06/26/20 0947  BP: 130/82  Pulse: 75  Temp: 98.2 F (36.8 C)  TempSrc: Oral  Weight: (!) 332 lb 6.4 oz (150.8 kg)  Height: 5' 11.4" (1.814 m)  PainSc: 0-No pain   Body mass index is 45.84 kg/m.   Objective:  Physical Exam Vitals reviewed.  Constitutional:  Appearance: Normal appearance. He is obese.  HENT:     Head: Normocephalic and atraumatic.     Right Ear: Tympanic membrane, ear canal and external ear normal. There is no impacted cerumen.     Left Ear: Tympanic membrane, ear canal and external ear normal. There is no impacted cerumen.     Nose:     Comments: Deferred - masked    Mouth/Throat:     Comments: Deferred - masked Cardiovascular:     Rate and Rhythm: Normal rate and regular rhythm.     Pulses: Normal pulses.      Heart sounds: Normal heart sounds. No murmur heard.   Pulmonary:     Effort: Pulmonary effort is normal. No respiratory distress.     Breath sounds: Normal breath sounds.  Abdominal:     General: Abdomen is flat. Bowel sounds are normal. There is no distension.     Palpations: Abdomen is soft.  Genitourinary:    Prostate: Normal.     Rectum: Guaiac result negative.  Musculoskeletal:        General: Normal range of motion.     Cervical back: Normal range of motion and neck supple.  Skin:    General: Skin is warm.     Capillary Refill: Capillary refill takes less than 2 seconds.  Neurological:     General: No focal deficit present.     Mental Status: He is alert and oriented to person, place, and time.  Psychiatric:        Mood and Affect: Mood normal.        Behavior: Behavior normal.        Thought Content: Thought content normal.        Judgment: Judgment normal.         Assessment And Plan:    1. Encounter for general adult medical examination w/o abnormal findings . Behavior modifications discussed and diet history reviewed.   . Pt will continue to exercise regularly and modify diet with low GI, plant based foods and decrease intake of processed foods.  . Recommend intake of daily multivitamin, Vitamin D, and calcium.  . Recommend for preventive screenings, as well as recommend immunizations that include influenza, TDAP - CBC no Diff  2. Encounter for hepatitis C screening test for low risk patient  Will check Hepatitis C screening due to recent recommendations to screen all adults 18 years and older - Hepatitis C antibody  3. Encounter for immunization  Influenza vaccine administered  Encouraged to take Tylenol as needed for fever or muscle aches. - Flu Vaccine QUAD 36+ mos IM  4. Encounter for prostate cancer screening - PSA  5. Morbid obesity with BMI of 45.0-49.9, adult ([place])  Congratulated on his 8 lb weight loss, he has changed his diet and has a more  strenuous  Continue with a healthy diet and regular activity  6. Essential hypertension  Chronic, blood pressure is fairly controlled  Continue with current medications - POCT Urinalysis Dipstick (81002) - POCT UA - Microalbumin - EKG 12-Lead - BMP8+EGFR  7. Sleep apnea, unspecified type  Continue wearing CPAP as directed by the sleep provider  8. Prediabetes  Chronic, stable  No current medications  Encouraged to limit intake of sugary foods and drinks  Encouraged to increase physical activity to 150 minutes per week - BMP8+EGFR - Lipid panel - Hemoglobin A1c    Patient was given opportunity to ask questions. Patient verbalized understanding of the plan and was able to  repeat key elements of the plan. All questions were answered to their satisfaction.     Teola Bradley, FNP, have reviewed all documentation for this visit. The documentation on 06/30/20 for the exam, diagnosis, procedures, and orders are all accurate and complete.   THE PATIENT IS ENCOURAGED TO PRACTICE SOCIAL DISTANCING DUE TO THE COVID-19 PANDEMIC.

## 2020-06-27 LAB — LIPID PANEL
Chol/HDL Ratio: 4.1 ratio (ref 0.0–5.0)
Cholesterol, Total: 192 mg/dL (ref 100–199)
HDL: 47 mg/dL (ref >39)
LDL Chol Calc (NIH): 133 mg/dL — ABNORMAL HIGH (ref 0–99)
Triglycerides: 63 mg/dL (ref 0–149)
VLDL Cholesterol Cal: 12 mg/dL (ref 5–40)

## 2020-06-27 LAB — HEPATITIS C ANTIBODY: Hep C Virus Ab: <0.1 s/co ratio (ref 0.0–0.9)

## 2020-06-27 LAB — CBC
Hematocrit: 43.9 % (ref 37.5–51.0)
Hemoglobin: 14.6 g/dL (ref 13.0–17.7)
MCH: 27.7 pg (ref 26.6–33.0)
MCHC: 33.3 g/dL (ref 31.5–35.7)
MCV: 83 fL (ref 79–97)
Platelets: 221 10*3/uL (ref 150–450)
RBC: 5.27 x10E6/uL (ref 4.14–5.80)
RDW: 12.8 % (ref 11.6–15.4)
WBC: 7 10*3/uL (ref 3.4–10.8)

## 2020-06-27 LAB — BMP8+EGFR
BUN/Creatinine Ratio: 9 (ref 9–20)
BUN: 9 mg/dL (ref 6–24)
CO2: 24 mmol/L (ref 20–29)
Calcium: 9.6 mg/dL (ref 8.7–10.2)
Chloride: 101 mmol/L (ref 96–106)
Creatinine, Ser: 1 mg/dL (ref 0.76–1.27)
GFR calc Af Amer: 108 mL/min/{1.73_m2} (ref >59)
GFR calc non Af Amer: 93 mL/min/{1.73_m2} (ref >59)
Glucose: 100 mg/dL — ABNORMAL HIGH (ref 65–99)
Potassium: 4.3 mmol/L (ref 3.5–5.2)
Sodium: 139 mmol/L (ref 134–144)

## 2020-06-27 LAB — PSA: Prostate Specific Ag, Serum: 0.7 ng/mL (ref 0.0–4.0)

## 2020-06-27 LAB — HEMOGLOBIN A1C
Est. average glucose Bld gHb Est-mCnc: 123 mg/dL
Hgb A1c MFr Bld: 5.9 % — ABNORMAL HIGH (ref 4.8–5.6)

## 2020-06-30 DIAGNOSIS — G4730 Sleep apnea, unspecified: Secondary | ICD-10-CM | POA: Insufficient documentation

## 2020-06-30 DIAGNOSIS — R7303 Prediabetes: Secondary | ICD-10-CM | POA: Insufficient documentation

## 2020-07-01 ENCOUNTER — Telehealth: Payer: Self-pay

## 2020-07-01 NOTE — Telephone Encounter (Addendum)
error 

## 2020-07-03 ENCOUNTER — Ambulatory Visit: Payer: Self-pay | Admitting: Nurse Practitioner

## 2020-07-09 ENCOUNTER — Other Ambulatory Visit: Payer: Self-pay | Admitting: Nurse Practitioner

## 2020-07-09 DIAGNOSIS — I10 Essential (primary) hypertension: Secondary | ICD-10-CM

## 2020-08-13 ENCOUNTER — Other Ambulatory Visit: Payer: Self-pay | Admitting: Nurse Practitioner

## 2020-08-13 DIAGNOSIS — I10 Essential (primary) hypertension: Secondary | ICD-10-CM

## 2020-08-18 ENCOUNTER — Telehealth: Payer: Self-pay | Admitting: Neurology

## 2020-08-18 NOTE — Telephone Encounter (Signed)
I called pt. He denies using a "So-Clean" machine or any ozone cleaner on his cpap. He has not subjected his cpap to extreme heat by leaving it in his car, etc. He reports that he has already registered his cpap with [facility] to request another cpap. I also recommended he contact his DME. I advised him that he can use his cpap at his discretion until he gets a replacement cpap since he did not indicate he used ozone cleaners or subjected it to extreme heat. He will keep his 09/04/2020 appt with Dr. Frances Furbish.

## 2020-08-18 NOTE — Telephone Encounter (Signed)
Patient stopped by the lobby this afternoon to state he got a call his CPAP was recalled. He was told to stop using it. 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 Best call back is [phone]

## 2020-09-04 ENCOUNTER — Encounter: Payer: Self-pay | Admitting: Neurology

## 2020-09-04 ENCOUNTER — Ambulatory Visit: Payer: 59 | Admitting: Neurology

## 2020-09-04 VITALS — BP 141/90 | HR 66 | Ht 72.0 in | Wt 337.0 lb

## 2020-09-04 DIAGNOSIS — G4733 Obstructive sleep apnea (adult) (pediatric): Secondary | ICD-10-CM | POA: Diagnosis not present

## 2020-09-04 DIAGNOSIS — Z9989 Dependence on other enabling machines and devices: Secondary | ICD-10-CM | POA: Diagnosis not present

## 2020-09-04 NOTE — Patient Instructions (Addendum)
Please continue to use your CPAP as much as possible.   You are supposed to get a replacement machine hopefully soon for your CPAP machine that has officially been recalled.  Please continue to be mindful of the cleanliness with the machine and do not have your machine exposed to high heat or to an ozone based cleaner such as so clean.  We will see you back in 1 year, continue to work on weight loss.  We may be able to reevaluate your sleep apnea with a home sleep test next year if you continue to have weight loss success.  Please follow-up in 1 year to see Butch Penny, nurse practitioner.

## 2020-09-04 NOTE — Progress Notes (Signed)
Subjective:    Patient ID: [name] [name] is a 39 y.o. male.  HPI     Interim history:  [name] [name] is a 39 year old right-handed gentleman with an underlying medical history of hypertension and morbid obesity with BMI of over 98, who presents for follow-up consultation of his obstructive sleep apnea, on CPAP therapy. The patient is unaccompanied today and presents for his 1 year check up. I last saw him on 09/19/18, at which time he reported some improvement in sleep quality and sleep consolidation. He was compliant with treatment and we talked about the importance of good sleep hygiene and allowing for enough sleep time.  He was advised to follow-up in 1 year.  He saw [name] [name], nurse practitioner on 09/04/2019, at which time he was suboptimal with his compliance and reported issues with the mask, it felt too large. He was advise to seek a mask refit.   Today, 09/04/2020: I reviewed his CPAP compliance data from 08/04/2020 through 09/02/2020, which is a total of 30 days, during which time he was just advised 18 days with percent use days greater than 4 hours at 46.7%, indicating suboptimal compliance, average usage for days on treatment of 4 hours and 38 minutes, residual AHI at goal at 1.1/h, pressure of 11 cm, some leak noted.  In the past 90 days his compliance was less, he was on treatment 41 out of 90 days with percent use days greater than 4 hours at 33.3%.  He reports that he has been doing fairly well with his CPAP.  He tries to make more time for sleep and has reduced his hours for his part-time job.  He reports that sometimes the mask is completely dislodged.  He wonders if that is why it shows lapses in treatment.  He has registered his machine because of the recall.  He has been in touch with his DME company about it.  He should get a replacement machine at some point hopefully.  He has not had any high heat exposure or sun exposure to the machine and has not used a so clean or ozone  based cleaning machine.    He is motivated to continue with treatment.  He is working on weight loss and compared to when we first met he has lost several pounds already.    The patient's allergies, current medications, family history, past medical history, past social history, past surgical history and problem list were reviewed and updated as appropriate.    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 He was advised to proceed with sleep study testing. He had a baseline sleep study, followed by a CPAP titration study. His baseline sleep study from 06/15/2018 showed a sleep efficiency of 89.1%, sleep latency of 22.5 minutes, REM latency of 82 minutes. He had a total AHI of 8.8 per hour, rising to 15.6 per hour during REM sleep. Average oxygen saturation was 94%, nadir was 78%. He had no significant PLMS. He was advised to proceed with CPAP therapy and return for titration study. He had this on 06/26/2018. Sleep latency was 14.5 minutes, sleep efficiency 93.2%, REM latency borderline reduced at 59.5 minutes. He had slightly increased percentage of REM sleep at 29%. He was titrated on CPAP from 5 cm to 11 cm. On the final pressure his AHI was 0 per hour  with nonsupine REM sleep achieved and O2 nadir of 92%. He had no significant PLMS during the study. Based on his test results I prescribed CPAP therapy for home use at a pressure of 11 cm.   I reviewed his CPAP compliance data from 08/19/2018 through 09/17/2018 which is a total of 30 days, during which time he used his device 29 days with percent used days greater than 4 hours at 76.7%, indicating adequate compliance with an average usage of 4 hours and 47 minutes, residual AHI at goal at 1.3 per hour, leak on the higher end, CPAP pressure of 11 cm.    06/06/2018: (He) is at risk for obstructive sleep apnea and was advised  to have a sleep study to rule out OSA based on his DOT physical as I understand. I reviewed your office note from 05/12/2018, which you kindly included. His Epworth sleepiness score is 4 out of 24, fatigue score is 9 out of 63. He is single and lives with his mother and his girlfriend. He has no children. He is a nonsmoker and does not utilize alcohol, drinks caffeine in the form of soda 2 per day and energy drinks 1 or 2 per day on average. He works at [employer], has a [employer]. He has to be at work at [employer]. Bedtime is around 10 PM and rise to him around 5:15. He denies night to night nocturia or restless leg symptoms. He denies morning headaches. He is not aware of any family history of OSA. His weight has been fluctuating, he is trying to lose weight and has lost about 5 pounds recently. He used to work third shift. He has a second part-time job. He is generally home by 7 PM. He is not aware of any snoring, girlfriend does not complain about it. He does not wake up gasping for air. He does take a nap in between his 2 jobs sometimes. His blood pressure is elevated today. He takes his blood pressure medicine each night around 11 PM. He watches TV in bed until he gets sleepy and turns it off. No pets in the bedroom.  His Past Medical History Is Significant For: Past Medical History:  Diagnosis Date  . Benign hypertension   . Morbid obesity ([place])     His Past Surgical History Is Significant For: No past surgical history on file.  His Family History Is Significant For: Family History  Problem Relation Age of Onset  . Hypertension Mother   . Hypertension Father     His Social History Is Significant For: Social History   Socioeconomic History  . Marital status: Single    Spouse name: Not on file  . Number of children: Not on file  . Years of education: Not on file  . Highest education level: Not on file  Occupational History  . Not on file  Tobacco Use  . Smoking status:  Never Smoker  . Smokeless tobacco: Never Used  Substance and Sexual Activity  . Alcohol use: Not on file  . Drug use: Never  . Sexual activity: Not on file  Other Topics Concern  . Not on file  Social History Narrative  . Not on file   Social Determinants of Health   Financial Resource Strain:   . Difficulty of Paying Living Expenses: Not on file  Food Insecurity:   . Worried About Charity fundraiser in the Last Year: Not on file  . Ran Out of Food in the Last  Year: Not on file  Transportation Needs:   . Lack of Transportation (Medical): Not on file  . Lack of Transportation (Non-Medical): Not on file  Physical Activity:   . Days of Exercise per Week: Not on file  . Minutes of Exercise per Session: Not on file  Stress:   . Feeling of Stress : Not on file  Social Connections:   . Frequency of Communication with Friends and Family: Not on file  . Frequency of Social Gatherings with Friends and Family: Not on file  . Attends Religious Services: Not on file  . Active Member of Clubs or Organizations: Not on file  . Attends Archivist Meetings: Not on file  . Marital Status: Not on file    His Allergies Are:  Allergies  Allergen Reactions  . Fish Allergy Anaphylaxis  :   His Current Medications Are:  Outpatient Encounter Medications as of 09/04/2020  Medication Sig  . olmesartan-hydrochlorothiazide (BENICAR HCT) 20-12.5 MG tablet TAKE 1 TABLET ONCE DAILY.   No facility-administered encounter medications on file as of 09/04/2020.  :  Review of Systems:  Out of a complete 14 point review of systems, all are reviewed and negative with the exception of these symptoms as listed below:  Review of Systems  Neurological:       RM 1, alone. Last seen 09/04/19 by MM,[name] [name]. Denies any new sx. States cpap usage going well. Waiting on new machine from DME. He is still using old machine that has recall.     Objective:  Neurological Exam  Physical Exam Physical  Examination:   Vitals:   09/04/20 0837  BP: (!) 141/90  Pulse: 66    General Examination: The patient is a very pleasant 39 y.o. male in no acute distress. He appears well-developed and well-nourished and well groomed.   HEENT:Normocephalic, atraumatic, pupils are equal, round and reactive to light, extraocular tracking is good without limitation to gaze excursion or nystagmus noted. Normal smooth pursuit is noted. Hearing is grossly intact. Face is symmetric with normal facial animation and normal facial sensation. Speech is clear with no dysarthria noted. There is no hypophonia. There is no lip, neck/head, jaw or voice tremor. Neck shows FROM. Oropharynx exam reveals: no significant mouth dryness, adequatedental hygiene and markedairway crowding, Mallampati is class III. Tongue protrudes centrally and palate elevates symmetrically.   Chest:Clear to auscultation without wheezing, rhonchi or crackles noted.  Heart:S1+S2+0, regular and normal without murmurs, rubs or gallops noted.   Abdomen:Soft, non-tender and non-distended.  Extremities:There isnopitting edema in the distal lower extremities bilaterally.   Skin: Warm and dry without trophic changes noted.  Musculoskeletal: exam reveals no obvious joint deformities, tenderness or joint swelling or erythema.   Neurologically:  Mental status: The patient is awake, alert and oriented in all 4 spheres.Hisimmediate and remote memory, attention, language skills and fund of knowledge are appropriate. There is no evidence of aphasia, agnosia, apraxia or anomia. Speech is clear with normal prosody and enunciation. Thought process is linear. Mood is normaland affect is normal.  Cranial nerves II - XII are as described above under HEENT exam. Motor exam: Normal bulk, strength and tone is noted. There is no tremor. Romberg is negative. Fine motor skills and coordination: grossly intact.  Cerebellar testing: No dysmetria or  intention tremor.  Sensory exam: intact to light touch in the upper and lower extremities.  Gait, station and balance:Hestands easily. No veering to one side is noted. No leaning to one side is noted.  Posture is age-appropriate and stance is narrow based. Gait showsnormalstride length and normalpace. No problems turning are noted.   Assessmentand Plan:  In summary,[name] Jordanis a very pleasant 69 year oldmalewith an underlying medical history of hypertension and morbid obesity with BMI of over 22, whopresents for follow up consultation of his obstructive sleep apnea, on CPAP therapy at a pressure of 11 cm with good reduction of his obstructive sleep apnea.  He has had some lapses in treatment.  He was compliant more consistently before.  He is encouraged to continue to use his machine regularly.  We talked about the importance of cleaning the supplies and replacing the mask and filters on a regular basis.  His machine is registered because of the recall.  He has not had any exposure to high heat to the machine and has not used an ozone based cleaning machine either.  He is willing to continue with treatment.  He is commended on his weight loss success and advised to follow-up routinely in 1 year to see one of our nurse practitioners.  Of note, he had a baseline sleep study in August 2019 followed by a CPAP titration study in September 2019. If he has ongoing good success with his weight loss we can revisit his sleep apnea diagnosis and order a home sleep test may be next year after his visit. I answered all his questions today and he was in agreement. I spent 30 minutes in total face-to-face time and in reviewing records during pre-charting, more than 50% of which was spent in counseling and coordination of care, reviewing test results, reviewing medications and treatment regimen and/or in discussing or reviewing the diagnosis of OSA, the prognosis and treatment options. Pertinent laboratory  and imaging test results that were available during this visit with the patient were reviewed by me and considered in my medical decision making (see chart for details).

## 2020-09-15 ENCOUNTER — Other Ambulatory Visit: Payer: Self-pay | Admitting: Nurse Practitioner

## 2020-09-15 DIAGNOSIS — I10 Essential (primary) hypertension: Secondary | ICD-10-CM

## 2020-09-25 ENCOUNTER — Telehealth: Payer: Self-pay | Admitting: Neurology

## 2020-09-25 NOTE — Telephone Encounter (Signed)
Patient stopped by the lobby to let Dr. Frances Furbish know he stopped using the CPAP last week. He saw there was a recall and had changed out with all new supplies.  He said that he feels dizzy every once in a while but stopped using the machine and he feels fine. Best call back is [phone]

## 2020-09-26 NOTE — Telephone Encounter (Signed)
Called the pt back to discuss further. 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 Advised the patient that Dr Frances Furbish would recommend continuing to use the machine until able to set up with newer machine. Advised the dizziness would not be associated with using the cpap machine and could indicate something else going on. Pt just wanted to clarify that it was ok to continue. Advised the patient to call us if he has any other issues or concerns. Pt verbalized understanding.

## 2020-10-16 ENCOUNTER — Other Ambulatory Visit: Payer: Self-pay | Admitting: Nurse Practitioner

## 2020-10-16 DIAGNOSIS — I10 Essential (primary) hypertension: Secondary | ICD-10-CM

## 2020-11-04 ENCOUNTER — Ambulatory Visit: Payer: 59 | Admitting: Nurse Practitioner

## 2020-11-04 ENCOUNTER — Encounter: Payer: Self-pay | Admitting: Nurse Practitioner

## 2020-11-04 ENCOUNTER — Other Ambulatory Visit: Payer: Self-pay

## 2020-11-04 VITALS — BP 130/84 | HR 74 | Temp 98.5°F | Ht 72.0 in | Wt 327.0 lb

## 2020-11-04 DIAGNOSIS — I10 Essential (primary) hypertension: Secondary | ICD-10-CM | POA: Diagnosis not present

## 2020-11-04 DIAGNOSIS — E7800 Pure hypercholesterolemia, unspecified: Secondary | ICD-10-CM | POA: Diagnosis not present

## 2020-11-04 DIAGNOSIS — R7303 Prediabetes: Secondary | ICD-10-CM | POA: Diagnosis not present

## 2020-11-04 DIAGNOSIS — G4730 Sleep apnea, unspecified: Secondary | ICD-10-CM | POA: Diagnosis not present

## 2020-11-04 MED ORDER — OLMESARTAN MEDOXOMIL-HCTZ 20-12.5 MG PO TABS: 1.0000 | ORAL_TABLET | Freq: Every day | ORAL | 1 refills | Status: DC

## 2020-11-04 NOTE — Patient Instructions (Signed)

## 2020-11-04 NOTE — Progress Notes (Signed)
I,Yamilka Roman [employer] as a Education administrator for [employer], FNP.,have documented all relevant documentation on the behalf of Minette Brine, FNP,as directed by  Minette Brine, FNP while in the presence of Minette Brine, [place]. This visit occurred during the SARS-CoV-2 public health emergency.  Safety protocols were in place, including screening questions prior to the visit, additional usage of staff PPE, and extensive cleaning of exam room while observing appropriate contact time as indicated for disinfecting solutions.  Subjective:     Patient ID: [name] [name] , male    DOB: 05/11/1981 , 40 y.o.   MRN: [id number]   Chief Complaint  Patient presents with  . Hypertension    HPI  Patient presents today for a blood pressure f/u   Wt Readings from Last 3 Encounters: 11/04/20 : (!) 327 lb (148.3 kg) 09/04/20 : (!) 337 lb (152.9 kg) 06/26/20 : (!) 332 lb 6.4 oz (150.8 kg)  He has been eating healthier and his job is more strenuous.    He continues to use his CPAP machine, tries to use 7 days a week.    Hypertension This is a chronic problem. The current episode started more than 1 year ago. The problem has been gradually improving since onset. The problem is controlled. Pertinent negatives include no anxiety, chest pain, headaches or palpitations. There are no associated agents to hypertension. Risk factors for coronary artery disease include sedentary lifestyle, male gender and obesity. Past treatments include diuretics and angiotensin blockers. The current treatment provides moderate improvement. Compliance problems include exercise.  There is no history of angina or kidney disease. There is no history of chronic renal disease.     Past Medical History:  Diagnosis Date  . Benign hypertension   . Morbid obesity ([place])      Family History  Problem Relation Age of Onset  . Hypertension Mother   . Hypertension Father      Current Outpatient Medications:  .   olmesartan-hydrochlorothiazide (BENICAR HCT) 20-12.5 MG tablet, Take 1 tablet by mouth daily., Disp: 90 tablet, Rfl: 1   Allergies  Allergen Reactions  . Fish Allergy Anaphylaxis     Review of Systems  Constitutional: Negative.   Respiratory: Negative.   Cardiovascular: Negative for chest pain, palpitations and leg swelling.  Neurological: Negative for dizziness and headaches.  Psychiatric/Behavioral: Negative.      Today's Vitals   11/04/20 0929  BP: 130/84  Pulse: 74  Temp: 98.5 F (36.9 C)  TempSrc: Oral  Weight: (!) 327 lb (148.3 kg)  Height: 6' (1.829 m)  PainSc: 0-No pain   Body mass index is 44.35 kg/m.   Objective:  Physical Exam Constitutional:      General: He is not in acute distress.    Appearance: Normal appearance. He is obese.  Cardiovascular:     Rate and Rhythm: Normal rate and regular rhythm.     Pulses: Normal pulses.     Heart sounds: Normal heart sounds. No murmur heard.   Pulmonary:     Effort: Pulmonary effort is normal. No respiratory distress.     Breath sounds: Normal breath sounds. No wheezing.  Abdominal:     General: Abdomen is flat. Bowel sounds are normal. There is no distension.     Palpations: Abdomen is soft.     Tenderness: There is no abdominal tenderness.  Musculoskeletal:        General: Normal range of motion.     Cervical back: Normal range of motion and neck supple.  Skin:  Capillary Refill: Capillary refill takes less than 2 seconds.  Neurological:     General: No focal deficit present.     Mental Status: He is alert and oriented to person, place, and time.     Cranial Nerves: No cranial nerve deficit.  Psychiatric:        Mood and Affect: Mood normal.        Behavior: Behavior normal.        Thought Content: Thought content normal.        Judgment: Judgment normal.         Assessment And Plan:     1. Essential hypertension  Chronic, much better controlled  Continue with current medications  Will  check kidney functions - olmesartan-hydrochlorothiazide (BENICAR HCT) 20-12.5 MG tablet; Take 1 tablet by mouth daily.  Dispense: 90 tablet; Refill: 1 - BMP8+eGFR  2. Sleep apnea, unspecified type  Chronic, continue with follow up with Neurology  3. Elevated LDL cholesterol level  Chronic, slightly elevated at last visit   Continue with diet controlled.  4. Prediabetes  Chronic, stable  Continue with current medications  Encouraged to limit intake of sugary foods and drinks  Encouraged to increase physical activity to 150 minutes per week as tolerated - Lipid panel - Hemoglobin A1c  5. Morbid (severe) obesity due to excess calories ([facility])  Chronic  Discussed healthy diet and regular exercise options   Encouraged to exercise at least 150 minutes per week, he does work a strenuous job   Patient was given opportunity to ask questions. Patient verbalized understanding of the plan and was able to repeat key elements of the plan. All questions were answered to their satisfaction.  Minette Brine, FNP    I, Minette Brine, FNP, have reviewed all documentation for this visit. The documentation on 11/04/20 for the exam, diagnosis, procedures, and orders are all accurate and complete.   THE PATIENT IS ENCOURAGED TO PRACTICE SOCIAL DISTANCING DUE TO THE COVID-19 PANDEMIC.

## 2020-11-05 ENCOUNTER — Other Ambulatory Visit: Payer: Self-pay

## 2020-11-05 ENCOUNTER — Other Ambulatory Visit: Payer: Self-pay | Admitting: Nurse Practitioner

## 2020-11-05 DIAGNOSIS — R7303 Prediabetes: Secondary | ICD-10-CM

## 2020-11-05 LAB — BMP8+EGFR
BUN/Creatinine Ratio: 9 (ref 9–20)
BUN: 9 mg/dL (ref 6–24)
CO2: 26 mmol/L (ref 20–29)
Calcium: 9.3 mg/dL (ref 8.7–10.2)
Chloride: 98 mmol/L (ref 96–106)
Creatinine, Ser: 1.03 mg/dL (ref 0.76–1.27)
GFR calc Af Amer: 104 mL/min/{1.73_m2} (ref >59)
GFR calc non Af Amer: 90 mL/min/{1.73_m2} (ref >59)
Glucose: 103 mg/dL — ABNORMAL HIGH (ref 65–99)
Potassium: 3.9 mmol/L (ref 3.5–5.2)
Sodium: 141 mmol/L (ref 134–144)

## 2020-11-05 LAB — HEMOGLOBIN A1C
Est. average glucose Bld gHb Est-mCnc: 131 mg/dL
Hgb A1c MFr Bld: 6.2 % — ABNORMAL HIGH (ref 4.8–5.6)

## 2020-11-05 LAB — LIPID PANEL
Chol/HDL Ratio: 3.8 ratio (ref 0.0–5.0)
Cholesterol, Total: 165 mg/dL (ref 100–199)
HDL: 44 mg/dL (ref >39)
LDL Chol Calc (NIH): 109 mg/dL — ABNORMAL HIGH (ref 0–99)
Triglycerides: 60 mg/dL (ref 0–149)
VLDL Cholesterol Cal: 12 mg/dL (ref 5–40)

## 2020-11-05 MED ORDER — METFORMIN HCL 500 MG PO TABS: 500.0000 mg | ORAL_TABLET | Freq: Two times a day (BID) | ORAL | 11 refills | Status: DC

## 2020-11-05 NOTE — Progress Notes (Signed)
Molli Knock will start him on metformin 500 mg two times a day he is to start with one tab in am with food for 2 weeks then increase to one tab two times a day twice a day with food.

## 2021-03-04 ENCOUNTER — Other Ambulatory Visit: Payer: Self-pay

## 2021-03-04 ENCOUNTER — Encounter: Payer: Self-pay | Admitting: Nurse Practitioner

## 2021-03-04 ENCOUNTER — Ambulatory Visit: Payer: 59 | Admitting: Nurse Practitioner

## 2021-03-04 VITALS — BP 118/72 | HR 70 | Temp 98.2°F | Ht 70.2 in | Wt 292.2 lb

## 2021-03-04 DIAGNOSIS — R7303 Prediabetes: Secondary | ICD-10-CM

## 2021-03-04 DIAGNOSIS — G4733 Obstructive sleep apnea (adult) (pediatric): Secondary | ICD-10-CM

## 2021-03-04 DIAGNOSIS — Z6841 Body Mass Index (BMI) 40.0 and over, adult: Secondary | ICD-10-CM

## 2021-03-04 DIAGNOSIS — E7800 Pure hypercholesterolemia, unspecified: Secondary | ICD-10-CM

## 2021-03-04 DIAGNOSIS — I10 Essential (primary) hypertension: Secondary | ICD-10-CM

## 2021-03-04 NOTE — Patient Instructions (Signed)
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
## 2021-03-05 ENCOUNTER — Telehealth: Payer: Self-pay

## 2021-03-05 LAB — CMP14+EGFR
ALT: 23 IU/L (ref 0–44)
AST: 19 IU/L (ref 0–40)
Albumin/Globulin Ratio: 1.3 (ref 1.2–2.2)
Albumin: 4.3 g/dL (ref 4.0–5.0)
Alkaline Phosphatase: 71 IU/L (ref 44–121)
BUN/Creatinine Ratio: 11 (ref 9–20)
BUN: 12 mg/dL (ref 6–24)
Bilirubin Total: 0.9 mg/dL (ref 0.0–1.2)
CO2: 23 mmol/L (ref 20–29)
Calcium: 9.8 mg/dL (ref 8.7–10.2)
Chloride: 101 mmol/L (ref 96–106)
Creatinine, Ser: 1.06 mg/dL (ref 0.76–1.27)
Globulin, Total: 3.2 g/dL (ref 1.5–4.5)
Glucose: 99 mg/dL (ref 65–99)
Potassium: 4.3 mmol/L (ref 3.5–5.2)
Sodium: 139 mmol/L (ref 134–144)
Total Protein: 7.5 g/dL (ref 6.0–8.5)
eGFR: 90 mL/min/{1.73_m2} (ref >59)

## 2021-03-05 LAB — HEMOGLOBIN A1C
Est. average glucose Bld gHb Est-mCnc: 123 mg/dL
Hgb A1c MFr Bld: 5.9 % — ABNORMAL HIGH (ref 4.8–5.6)

## 2021-03-05 LAB — LIPID PANEL
Chol/HDL Ratio: 3.5 ratio (ref 0.0–5.0)
Cholesterol, Total: 195 mg/dL (ref 100–199)
HDL: 55 mg/dL (ref >39)
LDL Chol Calc (NIH): 129 mg/dL — ABNORMAL HIGH (ref 0–99)
Triglycerides: 57 mg/dL (ref 0–149)
VLDL Cholesterol Cal: 11 mg/dL (ref 5–40)

## 2021-03-05 NOTE — Telephone Encounter (Signed)
Voicemail full.

## 2021-03-05 NOTE — Telephone Encounter (Signed)
-----   Message from Arnette Felts, FNP sent at 03/05/2021 10:07 AM EDT ----- Cholesterol levels are slightly up to 129 from 109. Kidney and liver functions are normal. HgbA1c is improved to 5.9 down from 6.2. continue what you are doing.

## 2021-04-07 ENCOUNTER — Encounter: Payer: Self-pay | Admitting: Nurse Practitioner

## 2021-04-07 ENCOUNTER — Other Ambulatory Visit: Payer: Self-pay

## 2021-04-07 ENCOUNTER — Ambulatory Visit: Payer: 59 | Admitting: Nurse Practitioner

## 2021-04-07 VITALS — BP 128/80 | HR 80 | Temp 98.4°F | Ht 70.0 in | Wt 292.0 lb

## 2021-04-07 DIAGNOSIS — F439 Reaction to severe stress, unspecified: Secondary | ICD-10-CM

## 2021-04-07 DIAGNOSIS — Z6841 Body Mass Index (BMI) 40.0 and over, adult: Secondary | ICD-10-CM

## 2021-04-07 NOTE — Progress Notes (Signed)
This visit occurred during the SARS-CoV-2 public health emergency.  Safety protocols were in place, including screening questions prior to the visit, additional usage of staff PPE, and extensive cleaning of exam room while observing appropriate contact time as indicated for disinfecting solutions.  Subjective:     Patient ID: [name] [name] , male    DOB: October 15, 1981 , 40 y.o.   MRN: [id number]   Chief Complaint  Patient presents with   Stress     HPI  Pt is here for a referral to a psychologist. Patient states the main reason is for stress. He is having a lot of issues with his current girlfriend. There are multiple issues and problems that he states they are working through. He is having a hard time dealing with the relationship. Patient denies sucidal thoughts or any harms to hurt himself or others.       Past Medical History:  Diagnosis Date   Benign hypertension    Morbid obesity ([facility])      Family History  Problem Relation Age of Onset   Hypertension Mother    Hypertension Father      Current Outpatient Medications:    metFORMIN (GLUCOPHAGE) 500 MG tablet, Take 1 tablet (500 mg total) by mouth 2 (two) times daily with a meal., Disp: 60 tablet, Rfl: 11   olmesartan-hydrochlorothiazide (BENICAR HCT) 20-12.5 MG tablet, Take 1 tablet by mouth daily., Disp: 90 tablet, Rfl: 1   Allergies  Allergen Reactions   Fish Allergy Anaphylaxis     Review of Systems  Constitutional:  Negative for chills and fatigue.  HENT:  Negative for congestion, sinus pressure and sinus pain.   Respiratory:  Negative for cough, chest tightness, shortness of breath and wheezing.   Neurological:  Negative for headaches.  Psychiatric/Behavioral:  Negative for agitation and confusion.     Today's Vitals   04/07/21 1536  BP: 128/80  Pulse: 80  Temp: 98.4 F (36.9 C)  SpO2: 94%  Weight: 292 lb (132.5 kg)  Height: 5\' 10"  (1.778 m)   Body mass index is 41.9 kg/m.  Wt Readings from Last 3  Encounters:  04/07/21 292 lb (132.5 kg)  03/04/21 292 lb 3.2 oz (132.5 kg)  11/04/20 (!) 327 lb (148.3 kg)    Objective:  Physical Exam Constitutional:      Appearance: Normal appearance. He is obese.  HENT:     Head: Normocephalic and atraumatic.  Cardiovascular:     Rate and Rhythm: Normal rate and regular rhythm.     Pulses: Normal pulses.     Heart sounds: Normal heart sounds. No murmur heard. Pulmonary:     Effort: Pulmonary effort is normal. No respiratory distress.     Breath sounds: Normal breath sounds. No wheezing.  Skin:    General: Skin is warm and dry.     Capillary Refill: Capillary refill takes less than 2 seconds.  Neurological:     Mental Status: He is alert and oriented to person, place, and time.  Psychiatric:        Mood and Affect: Mood normal.        Behavior: Behavior normal.        Judgment: Judgment normal.        Assessment And Plan:     1. Stress -Advised patient to talk to his friends and family along with his support system that works the best for him -Advised patient to seek therapy or counseling. -Advised patient to use relaxation methods such as massages,  exercise and mediation to help deal with his stress.  - Ambulatory referral to Psychology  2. Class 3 severe obesity due to excess calories with serious comorbidity and body mass index (BMI) of 40.0 to 44.9 in adult [facility])  Advised patient on a healthy diet including avoiding fast food and red meats. Increase the intake of lean meats including grilled chicken and [place].  Drink a lot of water. Decrease intake of fatty foods. Exercise for 30-45 min. 4-5 a week to decrease the risk of cardiac event.   Follow up: if symptoms persist or do not get better.   Patient was given opportunity to ask questions. Patient verbalized understanding of the plan and was able to repeat key elements of the plan. All questions were answered to their satisfaction.  Raman Kayode Petion, DNP   I, Raman Jacorie Ernsberger have  reviewed all documentation for this visit. The documentation on 04/07/21 for the exam, diagnosis, procedures, and orders are all accurate and complete.    IF YOU HAVE BEEN REFERRED TO A SPECIALIST, IT MAY TAKE 1-2 WEEKS TO SCHEDULE/PROCESS THE REFERRAL. IF YOU HAVE NOT HEARD FROM US/SPECIALIST IN TWO WEEKS, PLEASE GIVE [place] A CALL AT [phone].   THE PATIENT IS ENCOURAGED TO PRACTICE SOCIAL DISTANCING DUE TO THE COVID-19 PANDEMIC.

## 2021-06-04 ENCOUNTER — Ambulatory Visit: Payer: 59 | Admitting: Nurse Practitioner

## 2021-06-11 ENCOUNTER — Other Ambulatory Visit: Payer: Self-pay

## 2021-06-11 ENCOUNTER — Ambulatory Visit: Payer: 59 | Admitting: Nurse Practitioner

## 2021-06-11 ENCOUNTER — Encounter: Payer: Self-pay | Admitting: Nurse Practitioner

## 2021-06-11 VITALS — BP 114/80 | HR 68 | Temp 98.2°F | Ht 70.0 in | Wt 282.6 lb

## 2021-06-11 DIAGNOSIS — I10 Essential (primary) hypertension: Secondary | ICD-10-CM

## 2021-06-11 DIAGNOSIS — R7303 Prediabetes: Secondary | ICD-10-CM | POA: Diagnosis not present

## 2021-06-11 DIAGNOSIS — Z6841 Body Mass Index (BMI) 40.0 and over, adult: Secondary | ICD-10-CM | POA: Diagnosis not present

## 2021-06-11 MED ORDER — METFORMIN HCL 500 MG PO TABS: 500.0000 mg | ORAL_TABLET | Freq: Two times a day (BID) | ORAL | 1 refills | Status: DC

## 2021-06-11 MED ORDER — BLOOD GLUCOSE MONITOR KIT: PACK | 0 refills | Status: AC

## 2021-06-11 NOTE — Patient Instructions (Signed)
Cooking With Less Salt Cooking with less salt is one way to reduce the amount of sodium you get from food. Sodium is one of the elements that make up salt. It is found naturally in foods and is also added to certain foods. Depending on your condition and overall health, your health care provider or dietitian may recommend that you reduce your sodium intake. Most people should have less than 2,300 milligrams (mg) of sodium each day. If you have high blood pressure (hypertension), you may need to limit your sodium to 1,500 mg each day. Follow the tipsbelow to help reduce your sodium intake. What are tips for eating less sodium? Reading food labels  Check the food label before buying or using packaged ingredients. Always check the label for the serving size and sodium content. Look for products with no more than 140 mg of sodium in one serving. Check the % Daily Value column to see what percent of the daily recommended amount of sodium is provided in one serving of the product. Foods with 5% or less in this column are considered low in sodium. Foods with 20% or higher are considered high in sodium. Do not choose foods with salt as one of the first three ingredients on the ingredients list. If salt is one of the first three ingredients, it usually means the item is high in sodium.  Shopping Buy sodium-free or low-sodium products. Look for the following words on food labels: Low-sodium. Sodium-free. Reduced-sodium. No salt added. Unsalted. Always check the sodium content even if foods are labeled as low-sodium or no salt added. Buy fresh foods. Cooking Use herbs, seasonings without salt, and spices as substitutes for salt. Use sodium-free baking soda when baking. Grill, braise, or roast foods to add flavor with less salt. Avoid adding salt to pasta, rice, or hot cereals. Drain and rinse canned vegetables, beans, and meat before use. Avoid adding salt when cooking sweets and desserts. Cook with  low-sodium ingredients. What foods are high in sodium? Vegetables Regular canned vegetables (not low-sodium or reduced-sodium). Sauerkraut, pickled vegetables, and relishes. Olives. French fries. Onion rings. Regular canned tomato sauce and paste. Regular tomato and vegetable juice. Frozenvegetables in sauces. Grains Instant hot cereals. Bread stuffing, pancake, and biscuit mixes. Croutons. Seasoned rice or pasta mixes. Noodle soup cups. Boxed or frozen macaroni and cheese. Regular salted crackers. Self-rising flour. Rolls. Bagels. Flourtortillas and wraps. Meats and other proteins Meat or fish that is salted, canned, smoked, cured, spiced, or pickled. This includes bacon, ham, sausages, hot dogs, corned beef, chipped beef, meat loaves, salt pork, jerky, pickled herring, anchovies, regular canned tuna, andsardines. Salted nuts. Dairy Processed cheese and cheese spreads. Cheese curds. Blue cheese. Feta cheese.String cheese. Regular cottage cheese. Buttermilk. Canned milk. The items listed above may not be a complete list of foods high in sodium. Actual amounts of sodium may be different depending on processing. Contact a dietitian for more information. What foods are low in sodium? Fruits Fresh, frozen, or canned fruit with no sauce added. Fruit juice. Vegetables Fresh or frozen vegetables with no sauce added. "No salt added" canned vegetables. "No salt added" tomato sauce and paste. Low-sodium orreduced-sodium tomato and vegetable juice. Grains Noodles, pasta, quinoa, rice. Shredded or puffed wheat or puffed rice. Regular or quick oats (not instant). Low-sodium crackers. Low-sodium bread. Whole-grainbread and whole-grain pasta. Unsalted popcorn. Meats and other proteins Fresh or frozen whole meats, poultry (not injected with sodium), and fish with no sauce added. Unsalted nuts. Dried peas, beans, and   lentils without added salt. Unsalted canned beans. Eggs. Unsalted nut butters. Low-sodium canned  tunaor chicken. Dairy Milk. Soy milk. Yogurt. Low-sodium cheeses, such as Swiss, Monterey Jack, mozzarella, and ricotta. Sherbet or ice cream (keep to  cup per serving).Cream cheese. Fats and oils Unsalted butter or margarine. Other foods Homemade pudding. Sodium-free baking soda and baking powder. Herbs and spices.Low-sodium seasoning mixes. Beverages Coffee and tea. Carbonated beverages. The items listed above may not be a complete list of foods low in sodium. Actual amounts of sodium may be different depending on processing. Contact a dietitian for more information. What are some salt alternatives when cooking? The following are herbs, seasonings, and spices that can be used instead of salt to flavor your food. Herbs should be fresh or dried. Do not choose packaged mixes. Next to the name of the herb, spice, or seasoning aresome examples of foods you can pair it with. Herbs [place] leaves - Soups, meat and vegetable dishes, and spaghetti sauce. Basil - Italian dishes, soups, pasta, and fish dishes. Cilantro - Meat, poultry, and vegetable dishes. Chili powder - Marinades and Mexican dishes. Chives - Salad dressings and potato dishes. Cumin - Mexican dishes, couscous, and meat dishes. Dill - Fish dishes, sauces, and salads. Fennel - Meat and vegetable dishes, breads, and cookies. Garlic (do not use garlic salt) - Italian dishes, meat dishes, salad dressings, and sauces. Marjoram - Soups, potato dishes, and meat dishes. Oregano - Pizza and spaghetti sauce. Parsley - Salads, soups, pasta, and meat dishes. Rosemary - Italian dishes, salad dressings, soups, and red meats. Saffron - Fish dishes, pasta, and some poultry dishes. Sage - Stuffings and sauces. Tarragon - Fish and poultry dishes. Thyme - Stuffing, meat, and fish dishes. Seasonings Lemon juice - Fish dishes, poultry dishes, vegetables, and salads. Vinegar - Salad dressings, vegetables, and fish dishes. Spices Cinnamon - Sweet  dishes, such as cakes, cookies, and puddings. Cloves - Gingerbread, puddings, and marinades for meats. Curry - Vegetable dishes, fish and poultry dishes, and stir-fry dishes. Ginger - Vegetable dishes, fish dishes, and stir-fry dishes. Nutmeg - Pasta, vegetables, poultry, fish dishes, and custard. Summary Cooking with less salt is one way to reduce the amount of sodium that you get from food. Buy sodium-free or low-sodium products. Check the food label before using or buying packaged ingredients. Use herbs, seasonings without salt, and spices as substitutes for salt in foods. This information is not intended to replace advice given to you by your health care provider. Make sure you discuss any questions you have with your healthcare provider. Document Revised: 10/03/2019 Document Reviewed: 10/03/2019 Elsevier Patient Education  2022 Elsevier Inc.  

## 2021-06-11 NOTE — Progress Notes (Signed)
I,Katawbba Wiggins,acting as a Education administrator for [employer], FNP.,have documented all relevant documentation on the behalf of Minette Brine, FNP,as directed by  Minette Brine, FNP while in the presence of Minette Brine, [place].  This visit occurred during the SARS-CoV-2 public health emergency.  Safety protocols were in place, including screening questions prior to the visit, additional usage of staff PPE, and extensive cleaning of exam room while observing appropriate contact time as indicated for disinfecting solutions.  Subjective:     Patient ID: [name] [name] , male    DOB: Jul 22, 1981 , 40 y.o.   MRN: [id number]   Chief Complaint  Patient presents with   Hypertension    HPI  The patient is here today for blood pressure f/u.  Wt Readings from Last 3 Encounters: 06/11/21 : 282 lb 9.6 oz (128.2 kg) 04/07/21 : 292 lb (132.5 kg) 03/04/21 : 292 lb 3.2 oz (132.5 kg)    Hypertension This is a chronic problem. The current episode started more than 1 year ago. The problem has been gradually improving since onset. The problem is controlled. Pertinent negatives include no anxiety, chest pain, headaches, palpitations or shortness of breath. There are no associated agents to hypertension. Risk factors for coronary artery disease include sedentary lifestyle, male gender and obesity. Past treatments include diuretics and angiotensin blockers. The current treatment provides moderate improvement. Compliance problems include exercise.  There is no history of angina or kidney disease. There is no history of chronic renal disease.    Past Medical History:  Diagnosis Date   Benign hypertension    Morbid obesity ([place])      Family History  Problem Relation Age of Onset   Hypertension Mother    Hypertension Father      Current Outpatient Medications:    blood glucose meter kit and supplies KIT, Dispense based on patient and insurance preference. Use up to four times daily as directed., Disp: 1 each, Rfl:  0   olmesartan-hydrochlorothiazide (BENICAR HCT) 20-12.5 MG tablet, Take 1 tablet by mouth daily., Disp: 90 tablet, Rfl: 1   metFORMIN (GLUCOPHAGE) 500 MG tablet, Take 1 tablet (500 mg total) by mouth 2 (two) times daily with a meal., Disp: 90 tablet, Rfl: 1   Allergies  Allergen Reactions   Fish Allergy Anaphylaxis     Review of Systems  Constitutional: Negative.   Respiratory: Negative.  Negative for shortness of breath.   Cardiovascular: Negative.  Negative for chest pain and palpitations.  Gastrointestinal: Negative.   Neurological:  Negative for dizziness and headaches.  Psychiatric/Behavioral: Negative.    All other systems reviewed and are negative.   Today's Vitals   06/11/21 1621  BP: 114/80  Pulse: 68  Temp: 98.2 F (36.8 C)  TempSrc: Oral  Weight: 282 lb 9.6 oz (128.2 kg)  Height: 5' 10" (1.778 m)   Body mass index is 40.55 kg/m.  Wt Readings from Last 3 Encounters:  06/11/21 282 lb 9.6 oz (128.2 kg)  04/07/21 292 lb (132.5 kg)  03/04/21 292 lb 3.2 oz (132.5 kg)    BP Readings from Last 3 Encounters:  06/11/21 114/80  04/07/21 128/80  03/04/21 118/72    Objective:  Physical Exam Vitals reviewed.  Constitutional:      General: He is not in acute distress.    Appearance: Normal appearance. He is obese.  Cardiovascular:     Rate and Rhythm: Normal rate and regular rhythm.     Pulses: Normal pulses.     Heart sounds: Normal heart  sounds. No murmur heard. Pulmonary:     Effort: Pulmonary effort is normal. No respiratory distress.     Breath sounds: Normal breath sounds. No wheezing.  Abdominal:     General: Abdomen is flat. Bowel sounds are normal. There is no distension.     Palpations: Abdomen is soft.     Tenderness: There is no abdominal tenderness.  Musculoskeletal:        General: No swelling, tenderness or deformity. Normal range of motion.     Cervical back: Normal range of motion and neck supple.  Skin:    General: Skin is warm and dry.      Capillary Refill: Capillary refill takes less than 2 seconds.     Coloration: Skin is not jaundiced.  Neurological:     General: No focal deficit present.     Mental Status: He is alert and oriented to person, place, and time.     Cranial Nerves: No cranial nerve deficit.     Motor: No weakness.  Psychiatric:        Mood and Affect: Mood normal.        Behavior: Behavior normal.        Thought Content: Thought content normal.        Judgment: Judgment normal.        Assessment And Plan:     1. Essential hypertension Comments: Blood pressure is doing better, tolerating medications - CMP14+EGFR - CBC  2. Prediabetes Comments: Will check HgbA1c and he is to take metformin once a day he may be having low blood sugars - Hemoglobin A1c - blood glucose meter kit and supplies KIT; Dispense based on patient and insurance preference. Use up to four times daily as directed.  Dispense: 1 each; Refill: 0 - metFORMIN (GLUCOPHAGE) 500 MG tablet; Take 1 tablet (500 mg total) by mouth 2 (two) times daily with a meal.  Dispense: 90 tablet; Refill: 1  3. Class 3 severe obesity due to excess calories with serious comorbidity and body mass index (BMI) of 40.0 to 44.9 in adult [facility]) He is encouraged to initially strive for BMI less than 30 to decrease cardiac risk. He is advised to exercise no less than 150 minutes per week.   Congratulated on his 10 lb weight loss    [employer] as a Education administrator for [employer], FNP.,have documented all relevant documentation on the behalf of Minette Brine, FNP,as directed by  Minette Brine, FNP while in the presence of Minette Brine, [place].   Patient was given opportunity to ask questions. Patient verbalized understanding of the plan and was able to repeat key elements of the plan. All questions were answered to their satisfaction.  Minette Brine, FNP   I, Minette Brine, FNP, have reviewed all documentation for this visit. The documentation on 06/11/21 for the  exam, diagnosis, procedures, and orders are all accurate and complete.   IF YOU HAVE BEEN REFERRED TO A SPECIALIST, IT MAY TAKE 1-2 WEEKS TO SCHEDULE/PROCESS THE REFERRAL. IF YOU HAVE NOT HEARD FROM US/SPECIALIST IN TWO WEEKS, PLEASE GIVE [place] A CALL AT [phone] X [phone].   THE PATIENT IS ENCOURAGED TO PRACTICE SOCIAL DISTANCING DUE TO THE COVID-19 PANDEMIC.

## 2021-06-12 LAB — HEMOGLOBIN A1C
Est. average glucose Bld gHb Est-mCnc: 123 mg/dL
Hgb A1c MFr Bld: 5.9 % — ABNORMAL HIGH (ref 4.8–5.6)

## 2021-06-12 LAB — CBC
Hematocrit: 39.3 % (ref 37.5–51.0)
Hemoglobin: 12.8 g/dL — ABNORMAL LOW (ref 13.0–17.7)
MCH: 27.1 pg (ref 26.6–33.0)
MCHC: 32.6 g/dL (ref 31.5–35.7)
MCV: 83 fL (ref 79–97)
Platelets: 219 10*3/uL (ref 150–450)
RBC: 4.73 x10E6/uL (ref 4.14–5.80)
RDW: 12.5 % (ref 11.6–15.4)
WBC: 7.8 10*3/uL (ref 3.4–10.8)

## 2021-06-12 LAB — CMP14+EGFR
ALT: 22 IU/L (ref 0–44)
AST: 21 IU/L (ref 0–40)
Albumin/Globulin Ratio: 1.4 (ref 1.2–2.2)
Albumin: 3.5 g/dL — ABNORMAL LOW (ref 4.0–5.0)
Alkaline Phosphatase: 48 IU/L (ref 44–121)
BUN/Creatinine Ratio: 7 — ABNORMAL LOW (ref 9–20)
BUN: 6 mg/dL (ref 6–24)
Bilirubin Total: 0.3 mg/dL (ref 0.0–1.2)
CO2: 25 mmol/L (ref 20–29)
Calcium: 8.9 mg/dL (ref 8.7–10.2)
Chloride: 108 mmol/L — ABNORMAL HIGH (ref 96–106)
Creatinine, Ser: 0.85 mg/dL (ref 0.76–1.27)
Globulin, Total: 2.5 g/dL (ref 1.5–4.5)
Glucose: 92 mg/dL (ref 65–99)
Potassium: 3.9 mmol/L (ref 3.5–5.2)
Sodium: 142 mmol/L (ref 134–144)
Total Protein: 6 g/dL (ref 6.0–8.5)
eGFR: 111 mL/min/{1.73_m2} (ref >59)

## 2021-06-23 ENCOUNTER — Ambulatory Visit: Payer: PRIVATE HEALTH INSURANCE | Admitting: Psychology

## 2021-06-28 ENCOUNTER — Other Ambulatory Visit: Payer: Self-pay | Admitting: Nurse Practitioner

## 2021-06-28 DIAGNOSIS — I10 Essential (primary) hypertension: Secondary | ICD-10-CM

## 2021-07-02 ENCOUNTER — Other Ambulatory Visit: Payer: Self-pay

## 2021-07-02 ENCOUNTER — Encounter: Payer: Self-pay | Admitting: Nurse Practitioner

## 2021-07-02 ENCOUNTER — Ambulatory Visit (INDEPENDENT_AMBULATORY_CARE_PROVIDER_SITE_OTHER): Payer: Self-pay | Admitting: Nurse Practitioner

## 2021-07-02 VITALS — BP 118/76 | HR 67 | Temp 98.1°F | Ht 70.0 in | Wt 279.0 lb

## 2021-07-02 DIAGNOSIS — R7303 Prediabetes: Secondary | ICD-10-CM

## 2021-07-02 DIAGNOSIS — Z125 Encounter for screening for malignant neoplasm of prostate: Secondary | ICD-10-CM

## 2021-07-02 DIAGNOSIS — Z79899 Other long term (current) drug therapy: Secondary | ICD-10-CM

## 2021-07-02 DIAGNOSIS — Z6841 Body Mass Index (BMI) 40.0 and over, adult: Secondary | ICD-10-CM

## 2021-07-02 DIAGNOSIS — I10 Essential (primary) hypertension: Secondary | ICD-10-CM

## 2021-07-02 DIAGNOSIS — Z0000 Encounter for general adult medical examination without abnormal findings: Secondary | ICD-10-CM

## 2021-07-02 DIAGNOSIS — M79641 Pain in right hand: Secondary | ICD-10-CM

## 2021-07-02 DIAGNOSIS — E7800 Pure hypercholesterolemia, unspecified: Secondary | ICD-10-CM

## 2021-07-02 LAB — POCT URINALYSIS DIPSTICK
Blood, UA: NEGATIVE
Glucose, UA: NEGATIVE
Ketones, UA: NEGATIVE
Leukocytes, UA: NEGATIVE
Nitrite, UA: NEGATIVE
Protein, UA: NEGATIVE
Spec Grav, UA: >=1.03 — AB (ref 1.010–1.025)
Urobilinogen, UA: 4 E.U./dL — AB
pH, UA: 6 (ref 5.0–8.0)

## 2021-07-02 MED ORDER — OLMESARTAN MEDOXOMIL-HCTZ 20-12.5 MG PO TABS: 1.0000 | ORAL_TABLET | Freq: Every day | ORAL | 2 refills | Status: DC

## 2021-07-02 MED ORDER — OLMESARTAN MEDOXOMIL 20 MG PO TABS: 20.0000 mg | ORAL_TABLET | Freq: Every day | ORAL | 1 refills | Status: DC
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
## 2021-07-02 NOTE — Patient Instructions (Signed)
Health Maintenance, Male Adopting a healthy lifestyle and getting preventive care are important in promoting health and wellness. Ask your health care provider about: The right schedule for you to have regular tests and exams. Things you can do on your own to prevent diseases and keep yourself healthy. What should I know about diet, weight, and exercise? Eat a healthy diet  Eat a diet that includes plenty of vegetables, fruits, low-fat dairy products, and lean protein. Do not eat a lot of foods that are high in solid fats, added sugars, or sodium. Maintain a healthy weight Body mass index (BMI) is a measurement that can be used to identify possible weight problems. It estimates body fat based on height and weight. Your health care provider can help determine your BMI and help you achieve or maintain a healthy weight. Get regular exercise Get regular exercise. This is one of the most important things you can do for your health. Most adults should: Exercise for at least 150 minutes each week. The exercise should increase your heart rate and make you sweat (moderate-intensity exercise). Do strengthening exercises at least twice a week. This is in addition to the moderate-intensity exercise. Spend less time sitting. Even light physical activity can be beneficial. Watch cholesterol and blood lipids Have your blood tested for lipids and cholesterol at 42 years of age, then have this test every 5 years. You may need to have your cholesterol levels checked more often if: Your lipid or cholesterol levels are high. You are older than 42 years of age. You are at high risk for heart disease. What should I know about cancer screening? Many types of cancers can be detected early and may often be prevented. Depending on your health history and family history, you may need to have cancer screening at various ages. This may include screening for: Colorectal cancer. Prostate cancer. Skin cancer. Lung  cancer. What should I know about heart disease, diabetes, and high blood pressure? Blood pressure and heart disease High blood pressure causes heart disease and increases the risk of stroke. This is more likely to develop in people who have high blood pressure readings, are of African descent, or are overweight. Talk with your health care provider about your target blood pressure readings. Have your blood pressure checked: Every 3-5 years if you are 18-39 years of age. Every year if you are 40 years old or older. If you are between the ages of 65 and 75 and are a current or former smoker, ask your health care provider if you should have a one-time screening for abdominal aortic aneurysm (AAA). Diabetes Have regular diabetes screenings. This checks your fasting blood sugar level. Have the screening done: Once every three years after age 45 if you are at a normal weight and have a low risk for diabetes. More often and at a younger age if you are overweight or have a high risk for diabetes. What should I know about preventing infection? Hepatitis B If you have a higher risk for hepatitis B, you should be screened for this virus. Talk with your health care provider to find out if you are at risk for hepatitis B infection. Hepatitis C Blood testing is recommended for: Everyone born from 1945 through 1965. Anyone with known risk factors for hepatitis C. Sexually transmitted infections (STIs) You should be screened each year for STIs, including gonorrhea and chlamydia, if: You are sexually active and are younger than 42 years of age. You are older than 42 years   of age and your health care provider tells you that you are at risk for this type of infection. Your sexual activity has changed since you were last screened, and you are at increased risk for chlamydia or gonorrhea. Ask your health care provider if you are at risk. Ask your health care provider about whether you are at high risk for HIV.  Your health care provider may recommend a prescription medicine to help prevent HIV infection. If you choose to take medicine to prevent HIV, you should first get tested for HIV. You should then be tested every 3 months for as long as you are taking the medicine. Follow these instructions at home: Lifestyle Do not use any products that contain nicotine or tobacco, such as cigarettes, e-cigarettes, and chewing tobacco. If you need help quitting, ask your health care provider. Do not use street drugs. Do not share needles. Ask your health care provider for help if you need support or information about quitting drugs. Alcohol use Do not drink alcohol if your health care provider tells you not to drink. If you drink alcohol: Limit how much you have to 0-2 drinks a day. Be aware of how much alcohol is in your drink. In the [place] one drink equals one 12 oz bottle of beer (355 mL), one 5 oz glass of wine (148 mL), or one 1 oz glass of hard liquor (44 mL). General instructions Schedule regular health, dental, and eye exams. Stay current with your vaccines. Tell your health care provider if: You often feel depressed. You have ever been abused or do not feel safe at home. Summary Adopting a healthy lifestyle and getting preventive care are important in promoting health and wellness. Follow your health care provider's instructions about healthy diet, exercising, and getting tested or screened for diseases. Follow your health care provider's instructions on monitoring your cholesterol and blood pressure. This information is not intended to replace advice given to you by your health care provider. Make sure you discuss any questions you have with your health care provider. Document Revised: 12/19/2020 Document Reviewed: 10/04/2018 Elsevier Patient Education  2022 Elsevier Inc.  

## 2021-07-03 LAB — PSA: Prostate Specific Ag, Serum: 0.7 ng/mL (ref 0.0–4.0)

## 2021-07-03 LAB — CBC
Hematocrit: 39.8 % (ref 37.5–51.0)
Hemoglobin: 13.1 g/dL (ref 13.0–17.7)
MCH: 27.1 pg (ref 26.6–33.0)
MCHC: 32.9 g/dL (ref 31.5–35.7)
MCV: 82 fL (ref 79–97)
Platelets: 228 10*3/uL (ref 150–450)
RBC: 4.83 x10E6/uL (ref 4.14–5.80)
RDW: 12.5 % (ref 11.6–15.4)
WBC: 7.2 10*3/uL (ref 3.4–10.8)

## 2021-07-03 LAB — CMP14+EGFR
ALT: 26 IU/L (ref 0–44)
AST: 19 IU/L (ref 0–40)
Albumin/Globulin Ratio: 1.6 (ref 1.2–2.2)
Albumin: 3.5 g/dL — ABNORMAL LOW (ref 4.0–5.0)
Alkaline Phosphatase: 49 IU/L (ref 44–121)
BUN/Creatinine Ratio: 9 (ref 9–20)
BUN: 8 mg/dL (ref 6–24)
Bilirubin Total: 0.3 mg/dL (ref 0.0–1.2)
CO2: 24 mmol/L (ref 20–29)
Calcium: 8.7 mg/dL (ref 8.7–10.2)
Chloride: 106 mmol/L (ref 96–106)
Creatinine, Ser: 0.89 mg/dL (ref 0.76–1.27)
Globulin, Total: 2.2 g/dL (ref 1.5–4.5)
Glucose: 88 mg/dL (ref 65–99)
Potassium: 4.2 mmol/L (ref 3.5–5.2)
Sodium: 141 mmol/L (ref 134–144)
Total Protein: 5.7 g/dL — ABNORMAL LOW (ref 6.0–8.5)
eGFR: 110 mL/min/{1.73_m2} (ref >59)

## 2021-07-03 LAB — LIPID PANEL
Chol/HDL Ratio: 2.7 ratio (ref 0.0–5.0)
Cholesterol, Total: 151 mg/dL (ref 100–199)
HDL: 55 mg/dL (ref >39)
LDL Chol Calc (NIH): 88 mg/dL (ref 0–99)
Triglycerides: 35 mg/dL (ref 0–149)
VLDL Cholesterol Cal: 8 mg/dL (ref 5–40)

## 2021-07-03 LAB — MICROALBUMIN, URINE: Microalbumin, Urine: 21.4 ug/mL

## 2021-07-03 LAB — HEMOGLOBIN A1C
Est. average glucose Bld gHb Est-mCnc: 120 mg/dL
Hgb A1c MFr Bld: 5.8 % — ABNORMAL HIGH (ref 4.8–5.6)

## 2021-07-09 ENCOUNTER — Ambulatory Visit: Payer: PRIVATE HEALTH INSURANCE | Admitting: Psychology

## 2021-08-05 ENCOUNTER — Ambulatory Visit: Payer: 59 | Admitting: Nurse Practitioner

## 2021-08-25 ENCOUNTER — Ambulatory Visit: Payer: PRIVATE HEALTH INSURANCE | Admitting: Psychology

## 2021-09-02 ENCOUNTER — Encounter: Payer: Self-pay | Admitting: *Deleted

## 2021-09-03 ENCOUNTER — Encounter: Payer: 59 | Admitting: Adult Health

## 2021-09-07 NOTE — Progress Notes (Signed)
This encounter was created in error - please disregard.

## 2021-09-10 ENCOUNTER — Encounter: Payer: Self-pay | Admitting: *Deleted

## 2021-09-11 ENCOUNTER — Ambulatory Visit (INDEPENDENT_AMBULATORY_CARE_PROVIDER_SITE_OTHER): Payer: 59 | Admitting: Adult Health

## 2021-09-11 ENCOUNTER — Encounter: Payer: Self-pay | Admitting: Adult Health

## 2021-09-11 VITALS — BP 125/79 | HR 58 | Ht 72.0 in | Wt 282.6 lb

## 2021-09-11 DIAGNOSIS — G4733 Obstructive sleep apnea (adult) (pediatric): Secondary | ICD-10-CM

## 2021-09-11 DIAGNOSIS — Z9989 Dependence on other enabling machines and devices: Secondary | ICD-10-CM

## 2021-09-11 NOTE — Progress Notes (Signed)
PATIENT: [name] [name] DOB: Dec 17, 1980  REASON FOR VISIT: follow up HISTORY FROM: patient PRIMARY NEUROLOGIST: Dr. Rexene Alberts  HISTORY OF PRESENT ILLNESS: Today 09/11/21:  [name] is a 40 year old male with a history of obstructive sleep apnea on CPAP.  He returns today for follow-up.  The patient's download is attached to this note.  He reports that the CPAP works well for him.  He states that there are some nights that his mass comes off unknowingly to him.  He has lost a significant amount of weight since his sleep study in 2019.  He is curious if he still needs the CPAP.    REVIEW OF SYSTEMS: Out of a complete 14 system review of symptoms, the patient complains only of the following symptoms, and all other reviewed systems are negative.  ESS 3  ALLERGIES: Allergies  Allergen Reactions   Fish Allergy Anaphylaxis    HOME MEDICATIONS: Outpatient Medications Prior to Visit  Medication Sig Dispense Refill   blood glucose meter kit and supplies KIT Dispense based on patient and insurance preference. Use up to four times daily as directed. 1 each 0   metFORMIN (GLUCOPHAGE) 500 MG tablet Take 1 tablet (500 mg total) by mouth 2 (two) times daily with a meal. 90 tablet 1   olmesartan (BENICAR) 20 MG tablet Take 1 tablet (20 mg total) by mouth daily. 90 tablet 1   No facility-administered medications prior to visit.    PAST MEDICAL HISTORY: Past Medical History:  Diagnosis Date   Benign hypertension    Morbid obesity ([place])     PAST SURGICAL HISTORY: No past surgical history on file.  FAMILY HISTORY: Family History  Problem Relation Age of Onset   Hypertension Mother    Hypertension Father     SOCIAL HISTORY: Social History   Socioeconomic History   Marital status: Single    Spouse name: Not on file   Number of children: Not on file   Years of education: Not on file   Highest education level: Not on file  Occupational History   Not on file  Tobacco Use    Smoking status: Never   Smokeless tobacco: Never  Substance and Sexual Activity   Alcohol use: Not on file   Drug use: Never   Sexual activity: Not on file  Other Topics Concern   Not on file  Social History Narrative   Not on file   Social Determinants of Health   Financial Resource Strain: Not on file  Food Insecurity: Not on file  Transportation Needs: Not on file  Physical Activity: Not on file  Stress: Not on file  Social Connections: Not on file  Intimate Partner Violence: Not on file      PHYSICAL EXAM  Vitals:   09/11/21 1132  BP: 125/79  Pulse: (!) 58  Weight: 282 lb 9.6 oz (128.2 kg)  Height: 6' (1.829 m)   Body mass index is 38.33 kg/m.  Generalized: Well developed, in no acute distress  Chest: Lungs clear to auscultation bilaterally  Neurological examination  Mentation: Alert oriented to time, place, history taking. Follows all commands speech and language fluent Cranial nerve II-XII: Extraocular movements were full, visual field were full on confrontational test Head turning and shoulder shrug  were normal and symmetric. Motor: The motor testing reveals 5 over 5 strength of all 4 extremities. Good symmetric motor tone is noted throughout.  Sensory: Sensory testing is intact to soft touch on all 4 extremities. No evidence of  extinction is noted.  Gait and station: Gait is normal.    DIAGNOSTIC DATA (LABS, IMAGING, TESTING) - I reviewed patient records, labs, notes, testing and imaging myself where available.  Lab Results  Component Value Date   WBC 7.2 07/02/2021   HGB 13.1 07/02/2021   HCT 39.8 07/02/2021   MCV 82 07/02/2021   PLT 228 07/02/2021      Component Value Date/Time   NA 141 07/02/2021 1156   K 4.2 07/02/2021 1156   CL 106 07/02/2021 1156   CO2 24 07/02/2021 1156   GLUCOSE 88 07/02/2021 1156   BUN 8 07/02/2021 1156   CREATININE 0.89 07/02/2021 1156   CALCIUM 8.7 07/02/2021 1156   PROT 5.7 (L) 07/02/2021 1156   ALBUMIN 3.5 (L)  07/02/2021 1156   AST 19 07/02/2021 1156   ALT 26 07/02/2021 1156   ALKPHOS 49 07/02/2021 1156   BILITOT 0.3 07/02/2021 1156   GFRNONAA 90 11/04/2020 1011   GFRAA 104 11/04/2020 1011   Lab Results  Component Value Date   CHOL 151 07/02/2021   HDL 55 07/02/2021   LDLCALC 88 07/02/2021   TRIG 35 07/02/2021   CHOLHDL 2.7 07/02/2021   Lab Results  Component Value Date   HGBA1C 5.8 (H) 07/02/2021     ASSESSMENT AND PLAN 40 y.o. year old male  has a past medical history of Benign hypertension and Morbid obesity ([place]). here with:  OSA on CPAP  - CPAP compliance excellent - Good treatment of AHI  - Encourage patient to use CPAP nightly and > 4 hours each night -We will repeat home sleep test due to significant weight loss (61 lbs)   Ward Givens, MSN, NP-C 09/11/2021, 11:30 AM [facility] [address], [place], [place] [address] [phone]

## 2021-10-08 ENCOUNTER — Ambulatory Visit: Payer: 59 | Admitting: Nurse Practitioner

## 2021-10-14 ENCOUNTER — Ambulatory Visit: Payer: 59 | Admitting: Nurse Practitioner

## 2021-11-04 ENCOUNTER — Telehealth: Payer: Self-pay | Admitting: Adult Health

## 2021-11-04 NOTE — Telephone Encounter (Signed)
LVM for pt to call me back to schedule sleep study  

## 2021-11-10 ENCOUNTER — Telehealth: Payer: Self-pay | Admitting: Adult Health

## 2021-11-17 ENCOUNTER — Telehealth: Payer: Self-pay | Admitting: Neurology

## 2021-11-17 NOTE — Telephone Encounter (Signed)
We have attempted to call the patient 2 times to schedule sleep study. Patient has been unavailable at the phone numbers we have on file and has not returned our calls. If patient calls back we will schedule them for their sleep study. ° °

## 2021-12-23 ENCOUNTER — Telehealth: Payer: Self-pay | Admitting: Adult Health

## 2021-12-23 NOTE — Telephone Encounter (Signed)
Pt is asking for a call back, he was told he was going to be set up for an At Home Sleep Study, please call. ?

## 2021-12-23 NOTE — Telephone Encounter (Signed)
[name] [name] w/ phone room is getting pt transferred to sleep lab for scheduling. They have attempted to reach him three times already. ?

## 2022-01-19 ENCOUNTER — Other Ambulatory Visit: Payer: Self-pay | Admitting: Nurse Practitioner

## 2022-01-19 DIAGNOSIS — R7303 Prediabetes: Secondary | ICD-10-CM

## 2022-02-03 ENCOUNTER — Other Ambulatory Visit: Payer: Self-pay

## 2022-02-03 ENCOUNTER — Emergency Department (HOSPITAL_BASED_OUTPATIENT_CLINIC_OR_DEPARTMENT_OTHER)
Admission: EM | Admit: 2022-02-03 | Discharge: 2022-02-03 | Disposition: A | Discharge status: Home / Self Care | Payer: 59 | Attending: Emergency Medicine | Admitting: Emergency Medicine

## 2022-02-03 ENCOUNTER — Encounter (HOSPITAL_BASED_OUTPATIENT_CLINIC_OR_DEPARTMENT_OTHER): Payer: Self-pay | Admitting: *Deleted

## 2022-02-03 ENCOUNTER — Emergency Department (HOSPITAL_BASED_OUTPATIENT_CLINIC_OR_DEPARTMENT_OTHER): Payer: 59

## 2022-02-03 DIAGNOSIS — M79605 Pain in left leg: Secondary | ICD-10-CM | POA: Insufficient documentation

## 2022-02-03 DIAGNOSIS — S8012XA Contusion of left lower leg, initial encounter: Secondary | ICD-10-CM | POA: Insufficient documentation

## 2022-02-03 DIAGNOSIS — S8992XA Unspecified injury of left lower leg, initial encounter: Secondary | ICD-10-CM | POA: Diagnosis present

## 2022-02-03 DIAGNOSIS — X58XXXA Exposure to other specified factors, initial encounter: Secondary | ICD-10-CM | POA: Diagnosis not present

## 2022-02-03 LAB — CBC WITH DIFFERENTIAL/PLATELET
Abs Immature Granulocytes: 0.03 10*3/uL (ref 0.00–0.07)
Basophils Absolute: 0.1 10*3/uL (ref 0.0–0.1)
Basophils Relative: 1 %
Eosinophils Absolute: 0.3 10*3/uL (ref 0.0–0.5)
Eosinophils Relative: 4 %
HCT: 39.8 % (ref 39.0–52.0)
Hemoglobin: 13.2 g/dL (ref 13.0–17.0)
Immature Granulocytes: 0 %
Lymphocytes Relative: 20 %
Lymphs Abs: 1.7 10*3/uL (ref 0.7–4.0)
MCH: 28.2 pg (ref 26.0–34.0)
MCHC: 33.2 g/dL (ref 30.0–36.0)
MCV: 85 fL (ref 80.0–100.0)
Monocytes Absolute: 0.7 10*3/uL (ref 0.1–1.0)
Monocytes Relative: 9 %
Neutro Abs: 5.5 10*3/uL (ref 1.7–7.7)
Neutrophils Relative %: 66 %
Platelets: 174 10*3/uL (ref 150–400)
RBC: 4.68 MIL/uL (ref 4.22–5.81)
RDW: 13.2 % (ref 11.5–15.5)
WBC: 8.4 10*3/uL (ref 4.0–10.5)
nRBC: 0 % (ref 0.0–0.2)

## 2022-02-03 LAB — BASIC METABOLIC PANEL
Anion gap: 5 (ref 5–15)
BUN: 11 mg/dL (ref 6–20)
CO2: 25 mmol/L (ref 22–32)
Calcium: 8.1 mg/dL — ABNORMAL LOW (ref 8.9–10.3)
Chloride: 107 mmol/L (ref 98–111)
Creatinine, Ser: 0.98 mg/dL (ref 0.61–1.24)
GFR, Estimated: >60 mL/min (ref >60)
Glucose, Bld: 107 mg/dL — ABNORMAL HIGH (ref 70–99)
Potassium: 3.9 mmol/L (ref 3.5–5.1)
Sodium: 137 mmol/L (ref 135–145)

## 2022-02-03 NOTE — ED Provider Notes (Signed)
?[facility] EMERGENCY DEPARTMENT ?Provider Note ? ? ?CSN: [id number] ?Arrival date & time: 02/03/22  1129 ? ?  ? ?History ? ?Chief Complaint  ?Patient presents with  ? Leg Pain  ? ? ?[name] [name] is a 41 y.o. male. ? ?HPI ?41 year old male presents with left calf swelling.  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  He states that his leg slowly improved from a muscle strain but then last night he noticed calf swelling and his work told him to get ruled out for a DVT.  There is no pain.  No knee pain. ? ?Home Medications ?Prior to Admission medications   ?Medication Sig Start Date End Date Taking? Authorizing Provider  ?blood glucose meter kit and supplies KIT Dispense based on patient and insurance preference. Use up to four times daily as directed. 06/11/21   Minette Brine, FNP  ?metFORMIN (GLUCOPHAGE) 500 MG tablet TAKE 1 TABLET BY MOUTH TWICE DAILY WITH A MEAL. 01/19/22   Minette Brine, [place]  ?olmesartan (BENICAR) 20 MG tablet Take 1 tablet (20 mg total) by mouth daily. 07/02/21   Minette Brine, FNP  ?   ? ?Allergies    ?Fish allergy   ? ?Review of Systems   ?Review of Systems  ?Respiratory:  Negative for shortness of breath.   ?Cardiovascular:  Positive for leg swelling. Negative for chest pain.  ?Musculoskeletal:  Negative for myalgias.  ? ?Physical Exam ?Updated Vital Signs ?BP 122/85   Pulse 68   Temp 98.2 ?F (36.8 ?C) (Oral)   Resp 18   Ht 6' (1.829 m)   Wt 131.5 kg   SpO2 99%   BMI 39.33 kg/m?  ?Physical Exam ?Vitals and nursing note reviewed.  ?Constitutional:   ?   Appearance: He is well-developed.  ?HENT:  ?   Head: Normocephalic and atraumatic.  ?Cardiovascular:  ?   Rate and Rhythm: Normal rate and regular rhythm.  ?   Pulses:     ?     Dorsalis pedis pulses are 2+ on the left side.  ?Pulmonary:  ?   Effort: Pulmonary effort is normal.  ?Musculoskeletal:  ?   Comments: Left calf is diffusely swollen. No tenderness, erythema. No tenderness or decreased ROM to  knee  ?Skin: ?   General: Skin is warm and dry.  ?Neurological:  ?   Mental Status: He is alert.  ? ? ?ED Results / Procedures / Treatments   ?Labs ?(all labs ordered are listed, but only abnormal results are displayed) ?Labs Reviewed  ?BASIC METABOLIC PANEL - Abnormal; Notable for the following components:  ?    Result Value  ? Glucose, Bld 107 (*)   ? Calcium 8.1 (*)   ? All other components within normal limits  ?CBC WITH DIFFERENTIAL/PLATELET  ? ? ?EKG ?None ? ?Radiology ?US Venous Img Lower Unilateral Left ? ?Result Date: 02/03/2022 ?CLINICAL DATA:  41 year old male with left calf swelling. EXAM: LEFT LOWER EXTREMITY VENOUS DOPPLER ULTRASOUND TECHNIQUE: Gray-scale sonography with graded compression, as well as color Doppler and duplex ultrasound were performed to evaluate the left lower extremity deep venous systems from the level of the common femoral vein and including the common femoral, femoral, profunda femoral, popliteal and calf veins including the posterior tibial, peroneal and gastrocnemius veins when visible. Spectral Doppler was utilized to evaluate flow at rest and with distal augmentation maneuvers in the common femoral, femoral and popliteal veins. The contralateral common femoral vein was also evaluated for  comparison. COMPARISON:  None. FINDINGS: LEFT LOWER EXTREMITY Common Femoral Vein: No evidence of thrombus. Normal compressibility, respiratory phasicity and response to augmentation. Central Greater Saphenous Vein: No evidence of thrombus. Normal compressibility and flow on color Doppler imaging. Central Profunda Femoral Vein: No evidence of thrombus. Normal compressibility and flow on color Doppler imaging. Femoral Vein: No evidence of thrombus. Normal compressibility, respiratory phasicity and response to augmentation. Popliteal Vein: Poorly visualized. Calf Veins: Poorly visualized. Other Findings: Interposed between the gastrocnemius and soleus muscles in medial aspect of the calf is a  well-defined, heterogeneously hypoechoic structure measuring approximately 10.2 x 2.9 x 5.0 cm without appreciable internal vascularity. RIGHT LOWER EXTREMITY Common Femoral Vein: No evidence of thrombus. Normal compressibility, respiratory phasicity and response to augmentation. IMPRESSION: 1. No evidence of left lower extremity deep vein thrombosis. There is poor visualization of the popliteal and calf veins. 2. Intermuscular, mildly complex fluid collection in the left calf measuring up to 10.2 cm, favored to represent hematoma. Left lower extremity MRI could be considered for further characterization as clinically indicated. Ruthann Cancer, MD [facility] Radiology Electronically Signed   By: Ruthann Cancer M.D.   On: 02/03/2022 13:57   ? ?Procedures ?Procedures  ? ? ?Medications Ordered in ED ?Medications - No data to display ? ?ED Course/ Medical Decision Making/ A&P ?  ?                        ?Medical Decision Making ?Amount and/or Complexity of Data Reviewed ?Labs: ordered. ? ? ?Patient ultrasound shows no DVT but does show a 10 cm complex fluid collection this probably hematoma.  He has a swollen leg for sure but I have low suspicion for compartment syndrome.  He has no tenderness on palpation, normal pulse, normal sensation, and is resting comfortably.  Discussed with Dr. Ninfa Linden of orthopedics and he recommends rest, ice, compression and follow-up in his office in about a week.  There is no obvious infection at this time.  We did discuss return precautions but he appears stable for discharge home.  Labs obtained and were unremarkable.  Chart reviewed which confirms that he injured his leg at the very end of March.  Chart reviewed.  Discharge home with return precautions. ? ? ? ? ? ? ? ?Final Clinical Impression(s) / ED Diagnoses ?Final diagnoses:  ?Hematoma of left lower leg  ? ? ?Rx / DC Orders ?ED Discharge Orders   ? ? None  ? ?  ? ? ?  ?Sherwood Gambler,  MD ?02/03/22 1530 ? ?

## 2022-02-03 NOTE — ED Triage Notes (Signed)
Presents with leg pain and swelling, onset yesterday am. Urgent care sent pt to ED for evaluation for possible blood clot.  ?

## 2022-02-03 NOTE — Discharge Instructions (Addendum)
You have a 10 cm hematoma in your left calf in between the muscles.  It is important to rest, elevate your leg, apply ice and compression. ? ?If at any point you develop new or worsening pain, severe pain, redness overlying the skin, fever, chills, numbness or tingling or any other new/concerning symptoms then return to the ER or call 911 ?

## 2022-02-14 ENCOUNTER — Other Ambulatory Visit: Payer: Self-pay | Admitting: Nurse Practitioner

## 2022-02-14 DIAGNOSIS — I10 Essential (primary) hypertension: Secondary | ICD-10-CM

## 2022-03-29 ENCOUNTER — Encounter: Payer: Self-pay | Admitting: Nurse Practitioner

## 2022-03-29 ENCOUNTER — Ambulatory Visit (INDEPENDENT_AMBULATORY_CARE_PROVIDER_SITE_OTHER): Payer: PRIVATE HEALTH INSURANCE | Admitting: Nurse Practitioner

## 2022-03-29 VITALS — BP 128/72 | HR 69 | Temp 97.8°F

## 2022-03-29 DIAGNOSIS — F439 Reaction to severe stress, unspecified: Secondary | ICD-10-CM

## 2022-03-29 DIAGNOSIS — R7303 Prediabetes: Secondary | ICD-10-CM | POA: Diagnosis not present

## 2022-03-29 DIAGNOSIS — I10 Essential (primary) hypertension: Secondary | ICD-10-CM

## 2022-03-29 DIAGNOSIS — E7800 Pure hypercholesterolemia, unspecified: Secondary | ICD-10-CM | POA: Diagnosis not present

## 2022-03-29 NOTE — Progress Notes (Signed)
I,Tianna Badgett,acting as a Education administrator for [employer], FNP.,have documented all relevant documentation on the behalf of Minette Brine, FNP,as directed by  Minette Brine, FNP while in the presence of Minette Brine, [place].  This visit occurred during the SARS-CoV-2 public health emergency.  Safety protocols were in place, including screening questions prior to the visit, additional usage of staff PPE, and extensive cleaning of exam room while observing appropriate contact time as indicated for disinfecting solutions.  Subjective:     Patient ID: [name] [name] , male    DOB: 1981-02-15 , 41 y.o.   MRN: [id number]   Chief Complaint  Patient presents with   Hypertension    HPI  The patient is here today for blood pressure f/u. Inquired about given plasma with hypertension. I have advised him the center has criteria for plasma donation. His blood pressure at this time is well controlled.   Hypertension This is a chronic problem. The current episode started more than 1 year ago. The problem has been gradually improving since onset. The problem is controlled. Pertinent negatives include no anxiety, chest pain, headaches, palpitations or shortness of breath. There are no associated agents to hypertension. Risk factors for coronary artery disease include sedentary lifestyle, male gender and obesity. Past treatments include diuretics and angiotensin blockers. The current treatment provides moderate improvement. Compliance problems include exercise.  There is no history of angina or kidney disease. There is no history of chronic renal disease.    Past Medical History:  Diagnosis Date   Benign hypertension    Morbid obesity ([place])      Family History  Problem Relation Age of Onset   Hypertension Mother    Hypertension Father      Current Outpatient Medications:    blood glucose meter kit and supplies KIT, Dispense based on patient and insurance preference. Use up to four times daily as directed.,  Disp: 1 each, Rfl: 0   metFORMIN (GLUCOPHAGE) 500 MG tablet, TAKE 1 TABLET BY MOUTH TWICE DAILY WITH A MEAL., Disp: 60 tablet, Rfl: 0   olmesartan (BENICAR) 20 MG tablet, TAKE ONE TABLET BY MOUTH ONE TIME DAILY, Disp: 30 tablet, Rfl: 0   Allergies  Allergen Reactions   Fish Allergy Anaphylaxis     Review of Systems  Constitutional: Negative.   Respiratory: Negative.  Negative for shortness of breath.   Cardiovascular: Negative.  Negative for chest pain, palpitations and leg swelling.  Gastrointestinal: Negative.   Neurological: Negative.  Negative for headaches.  Psychiatric/Behavioral: Negative.      Today's Vitals   03/29/22 2036  BP: 128/72  Pulse: 69  Temp: 97.8 F (36.6 C)  TempSrc: Oral   There is no height or weight on file to calculate BMI.   Objective:  Physical Exam Vitals reviewed.  Constitutional:      General: He is not in acute distress.    Appearance: Normal appearance. He is obese.  Cardiovascular:     Rate and Rhythm: Normal rate and regular rhythm.     Pulses: Normal pulses.     Heart sounds: Normal heart sounds. No murmur heard. Pulmonary:     Effort: Pulmonary effort is normal. No respiratory distress.     Breath sounds: Normal breath sounds. No wheezing.  Abdominal:     General: Abdomen is flat. Bowel sounds are normal. There is no distension.     Palpations: Abdomen is soft.     Tenderness: There is no abdominal tenderness.  Skin:    General: Skin  is warm and dry.     Capillary Refill: Capillary refill takes less than 2 seconds.     Coloration: Skin is not jaundiced.  Neurological:     General: No focal deficit present.     Mental Status: He is alert and oriented to person, place, and time.     Cranial Nerves: No cranial nerve deficit.     Motor: No weakness.  Psychiatric:        Mood and Affect: Mood normal.        Behavior: Behavior normal.        Thought Content: Thought content normal.        Judgment: Judgment normal.         Assessment And Plan:     1. Essential hypertension Comments: Blood pressure is well controlled, continue current medications - BMP8+EGFR; Future  2. Elevated LDL cholesterol level Comments: Slightly elevated at last visit. - Lipid panel; Future  3. Prediabetes Comments: Diet controlled, continue healthy diet and regular exercise as tolerated at least 150 minutes a week. - Hemoglobin A1c; Future  4. Stress Comments: Reports he did not receive a call from the counselor referral after reviewing the chart it is notated the counselor left 3 vm's and sent 3 emails to schedule     Patient was given opportunity to ask questions. Patient verbalized understanding of the plan and was able to repeat key elements of the plan. All questions were answered to their satisfaction.  Minette Brine, FNP   I, Minette Brine, FNP, have reviewed all documentation for this visit. The documentation on 03/29/22 for the exam, diagnosis, procedures, and orders are all accurate and complete.   IF YOU HAVE BEEN REFERRED TO A SPECIALIST, IT MAY TAKE 1-2 WEEKS TO SCHEDULE/PROCESS THE REFERRAL. IF YOU HAVE NOT HEARD FROM US/SPECIALIST IN TWO WEEKS, PLEASE GIVE [place] A CALL AT [phone].   THE PATIENT IS ENCOURAGED TO PRACTICE SOCIAL DISTANCING DUE TO THE COVID-19 PANDEMIC.

## 2022-03-29 NOTE — Patient Instructions (Signed)
Hypertension, Adult High blood pressure (hypertension) is when the force of blood pumping through the arteries is too strong. The arteries are the blood vessels that carry blood from the heart throughout the body. Hypertension forces the heart to work harder to pump blood and may cause arteries to become narrow or stiff. Untreated or uncontrolled hypertension can lead to a heart attack, heart failure, a stroke, kidney disease, and other problems. A blood pressure reading consists of a higher number over a lower number. Ideally, your blood pressure should be below 120/80. The first ("top") number is called the systolic pressure. It is a measure of the pressure in your arteries as your heart beats. The second ("bottom") number is called the diastolic pressure. It is a measure of the pressure in your arteries as the heart relaxes. What are the causes? The exact cause of this condition is not known. There are some conditions that result in high blood pressure. What increases the risk? Certain factors may make you more likely to develop high blood pressure. Some of these risk factors are under your control, including: Smoking. Not getting enough exercise or physical activity. Being overweight. Having too much fat, sugar, calories, or salt (sodium) in your diet. Drinking too much alcohol. Other risk factors include: Having a personal history of heart disease, diabetes, high cholesterol, or kidney disease. Stress. Having a family history of high blood pressure and high cholesterol. Having obstructive sleep apnea. Age. The risk increases with age. What are the signs or symptoms? High blood pressure may not cause symptoms. Very high blood pressure (hypertensive crisis) may cause: Headache. Fast or irregular heartbeats (palpitations). Shortness of breath. Nosebleed. Nausea and vomiting. Vision changes. Severe chest pain, dizziness, and seizures. How is this diagnosed? This condition is diagnosed by  measuring your blood pressure while you are seated, with your arm resting on a flat surface, your legs uncrossed, and your feet flat on the floor. The cuff of the blood pressure monitor will be placed directly against the skin of your upper arm at the level of your heart. Blood pressure should be measured at least twice using the same arm. Certain conditions can cause a difference in blood pressure between your right and left arms. If you have a high blood pressure reading during one visit or you have normal blood pressure with other risk factors, you may be asked to: Return on a different day to have your blood pressure checked again. Monitor your blood pressure at home for 1 week or longer. If you are diagnosed with hypertension, you may have other blood or imaging tests to help your health care provider understand your overall risk for other conditions. How is this treated? This condition is treated by making healthy lifestyle changes, such as eating healthy foods, exercising more, and reducing your alcohol intake. You may be referred for counseling on a healthy diet and physical activity. Your health care provider may prescribe medicine if lifestyle changes are not enough to get your blood pressure under control and if: Your systolic blood pressure is above 130. Your diastolic blood pressure is above 80. Your personal target blood pressure may vary depending on your medical conditions, your age, and other factors. Follow these instructions at home: Eating and drinking  Eat a diet that is high in fiber and potassium, and low in sodium, added sugar, and fat. An example of this eating plan is called the DASH diet. DASH stands for Dietary Approaches to Stop Hypertension. To eat this way: Eat   plenty of fresh fruits and vegetables. Try to fill one half of your plate at each meal with fruits and vegetables. Eat whole grains, such as whole-wheat pasta, brown rice, or whole-grain bread. Fill about one  fourth of your plate with whole grains. Eat or drink low-fat dairy products, such as skim milk or low-fat yogurt. Avoid fatty cuts of meat, processed or cured meats, and poultry with skin. Fill about one fourth of your plate with lean proteins, such as fish, chicken without skin, beans, eggs, or tofu. Avoid pre-made and processed foods. These tend to be higher in sodium, added sugar, and fat. Reduce your daily sodium intake. Many people with hypertension should eat less than 1,500 mg of sodium a day. Do not drink alcohol if: Your health care provider tells you not to drink. You are pregnant, may be pregnant, or are planning to become pregnant. If you drink alcohol: Limit how much you have to: 0-1 drink a day for women. 0-2 drinks a day for men. Know how much alcohol is in your drink. In the [place] one drink equals one 12 oz bottle of beer (355 mL), one 5 oz glass of wine (148 mL), or one 1 oz glass of hard liquor (44 mL). Lifestyle  Work with your health care provider to maintain a healthy body weight or to lose weight. Ask what an ideal weight is for you. Get at least 30 minutes of exercise that causes your heart to beat faster (aerobic exercise) most days of the week. Activities may include walking, swimming, or biking. Include exercise to strengthen your muscles (resistance exercise), such as Pilates or lifting weights, as part of your weekly exercise routine. Try to do these types of exercises for 30 minutes at least 3 days a week. Do not use any products that contain nicotine or tobacco. These products include cigarettes, chewing tobacco, and vaping devices, such as e-cigarettes. If you need help quitting, ask your health care provider. Monitor your blood pressure at home as told by your health care provider. Keep all follow-up visits. This is important. Medicines Take over-the-counter and prescription medicines only as told by your health care provider. Follow directions carefully. Blood  pressure medicines must be taken as prescribed. Do not skip doses of blood pressure medicine. Doing this puts you at risk for problems and can make the medicine less effective. Ask your health care provider about side effects or reactions to medicines that you should watch for. Contact a health care provider if you: Think you are having a reaction to a medicine you are taking. Have headaches that keep coming back (recurring). Feel dizzy. Have swelling in your ankles. Have trouble with your vision. Get help right away if you: Develop a severe headache or confusion. Have unusual weakness or numbness. Feel faint. Have severe pain in your chest or abdomen. Vomit repeatedly. Have trouble breathing. These symptoms may be an emergency. Get help right away. Call 911. Do not wait to see if the symptoms will go away. Do not drive yourself to the hospital. Summary Hypertension is when the force of blood pumping through your arteries is too strong. If this condition is not controlled, it may put you at risk for serious complications. Your personal target blood pressure may vary depending on your medical conditions, your age, and other factors. For most people, a normal blood pressure is less than 120/80. Hypertension is treated with lifestyle changes, medicines, or a combination of both. Lifestyle changes include losing weight, eating a healthy,   low-sodium diet, exercising more, and limiting alcohol. This information is not intended to replace advice given to you by your health care provider. Make sure you discuss any questions you have with your health care provider. Document Revised: 08/18/2021 Document Reviewed: 08/18/2021 Elsevier Patient Education  2023 Elsevier Inc.  

## 2022-04-01 ENCOUNTER — Other Ambulatory Visit: Payer: PRIVATE HEALTH INSURANCE

## 2022-04-01 ENCOUNTER — Other Ambulatory Visit: Payer: Self-pay

## 2022-04-01 DIAGNOSIS — I10 Essential (primary) hypertension: Secondary | ICD-10-CM

## 2022-04-01 DIAGNOSIS — E7800 Pure hypercholesterolemia, unspecified: Secondary | ICD-10-CM

## 2022-04-01 DIAGNOSIS — R7303 Prediabetes: Secondary | ICD-10-CM

## 2022-04-01 LAB — BMP8+EGFR
BUN/Creatinine Ratio: 11 (ref 9–20)
BUN: 10 mg/dL (ref 6–24)
CO2: 21 mmol/L (ref 20–29)
Calcium: 9.2 mg/dL (ref 8.7–10.2)
Chloride: 105 mmol/L (ref 96–106)
Creatinine, Ser: 0.88 mg/dL (ref 0.76–1.27)
Glucose: 97 mg/dL (ref 70–99)
Potassium: 4.1 mmol/L (ref 3.5–5.2)
Sodium: 140 mmol/L (ref 134–144)
eGFR: 109 mL/min/{1.73_m2} (ref >59)

## 2022-04-01 LAB — HEMOGLOBIN A1C
Est. average glucose Bld gHb Est-mCnc: 120 mg/dL
Hgb A1c MFr Bld: 5.8 % — ABNORMAL HIGH (ref 4.8–5.6)

## 2022-04-02 LAB — LIPID PANEL
Chol/HDL Ratio: 2.8 ratio (ref 0.0–5.0)
Cholesterol, Total: 177 mg/dL (ref 100–199)
HDL: 63 mg/dL (ref >39)
LDL Chol Calc (NIH): 104 mg/dL — ABNORMAL HIGH (ref 0–99)
Triglycerides: 49 mg/dL (ref 0–149)
VLDL Cholesterol Cal: 10 mg/dL (ref 5–40)

## 2022-05-19 ENCOUNTER — Other Ambulatory Visit: Payer: Self-pay | Admitting: Nurse Practitioner

## 2022-05-19 DIAGNOSIS — I10 Essential (primary) hypertension: Secondary | ICD-10-CM

## 2022-05-20 ENCOUNTER — Other Ambulatory Visit: Payer: Self-pay

## 2022-05-20 DIAGNOSIS — I10 Essential (primary) hypertension: Secondary | ICD-10-CM

## 2022-05-20 MED ORDER — OLMESARTAN MEDOXOMIL 20 MG PO TABS: 20.0000 mg | ORAL_TABLET | Freq: Every day | ORAL | 1 refills | Status: DC

## 2022-07-12 ENCOUNTER — Encounter: Payer: Self-pay | Admitting: Nurse Practitioner

## 2022-07-12 ENCOUNTER — Ambulatory Visit (INDEPENDENT_AMBULATORY_CARE_PROVIDER_SITE_OTHER): Payer: Commercial Managed Care - HMO | Admitting: Nurse Practitioner

## 2022-07-12 ENCOUNTER — Encounter: Payer: 59 | Admitting: Nurse Practitioner

## 2022-07-12 VITALS — BP 132/88 | HR 67 | Temp 97.9°F | Ht 72.0 in | Wt 304.0 lb

## 2022-07-12 DIAGNOSIS — Z0001 Encounter for general adult medical examination with abnormal findings: Secondary | ICD-10-CM

## 2022-07-12 DIAGNOSIS — Z79899 Other long term (current) drug therapy: Secondary | ICD-10-CM

## 2022-07-12 DIAGNOSIS — Z125 Encounter for screening for malignant neoplasm of prostate: Secondary | ICD-10-CM

## 2022-07-12 DIAGNOSIS — G4733 Obstructive sleep apnea (adult) (pediatric): Secondary | ICD-10-CM

## 2022-07-12 DIAGNOSIS — R7303 Prediabetes: Secondary | ICD-10-CM

## 2022-07-12 DIAGNOSIS — I10 Essential (primary) hypertension: Secondary | ICD-10-CM

## 2022-07-12 DIAGNOSIS — Z9989 Dependence on other enabling machines and devices: Secondary | ICD-10-CM

## 2022-07-12 DIAGNOSIS — E66813 Obesity, class 3: Secondary | ICD-10-CM

## 2022-07-12 DIAGNOSIS — Z6841 Body Mass Index (BMI) 40.0 and over, adult: Secondary | ICD-10-CM

## 2022-07-12 DIAGNOSIS — Z0000 Encounter for general adult medical examination without abnormal findings: Secondary | ICD-10-CM

## 2022-07-12 LAB — POCT URINALYSIS DIPSTICK
Blood, UA: NEGATIVE
Glucose, UA: NEGATIVE
Leukocytes, UA: NEGATIVE
Nitrite, UA: NEGATIVE
Protein, UA: NEGATIVE
Spec Grav, UA: 1.025 (ref 1.010–1.025)
Urobilinogen, UA: 1 E.U./dL
pH, UA: 6 (ref 5.0–8.0)

## 2022-07-12 MED ORDER — METFORMIN HCL 500 MG PO TABS: 500.0000 mg | ORAL_TABLET | Freq: Two times a day (BID) | ORAL | 1 refills | Status: AC

## 2022-07-12 MED ORDER — OLMESARTAN MEDOXOMIL 20 MG PO TABS: 20.0000 mg | ORAL_TABLET | Freq: Every day | ORAL | 1 refills | Status: AC

## 2022-07-12 NOTE — Progress Notes (Signed)
I,Tianna Badgett,acting as a Education administrator for [employer], FNP.,have documented all relevant documentation on the behalf of Minette Brine, FNP,as directed by  Minette Brine, FNP while in the presence of Minette Brine, [place].  Subjective:     Patient ID: [name] [name] , male    DOB: [birth year] , 41 y.o.   MRN: [id number]   Chief Complaint  Patient presents with   Annual Exam    HPI  Here for HM. No other concerns. 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 He left [employer] due to getting bit by a dog risk on multiple occasions.   BP Readings from Last 3 Encounters: 07/12/22 : (!) 132/98 03/29/22 : 128/72 02/03/22 : 122/85  Wt Readings from Last 3 Encounters: 07/12/22 : (!) 304 lb (137.9 kg) 02/03/22 : 290 lb (131.5 kg) 09/11/21 : 282 lb 9.6 oz (128.2 kg)        Past Medical History:  Diagnosis Date   Benign hypertension    Morbid obesity ([place])      Family History  Problem Relation Age of Onset   Hypertension Mother    Hypertension Father      Current Outpatient Medications:    blood glucose meter kit and supplies KIT, Dispense based on patient and insurance preference. Use up to four times daily as directed., Disp: 1 each, Rfl: 0   metFORMIN (GLUCOPHAGE) 500 MG tablet, Take 1 tablet (500 mg total) by mouth 2 (two) times daily with a meal., Disp: 180 tablet, Rfl: 1   olmesartan (BENICAR) 20 MG tablet, Take 1 tablet (20 mg total) by mouth daily., Disp: 90 tablet, Rfl: 1   Allergies  Allergen Reactions   Fish Allergy Anaphylaxis     Men's preventive visit. Patient Health Questionnaire (PHQ-2) is  [place] Office Visit from 07/12/2022 in [facility]  PHQ-2 Total Score 0     Patient is on a Regular diet;he has cut back on his intake of food. Exercising - with lifting at work. He also does a cleaning job.  Marital status: Single. Relevant history for alcohol use is:  Social History   Substance and Sexual Activity  Alcohol Use None   Relevant  history for tobacco use is:  Social History   Tobacco Use  Smoking Status Never  Smokeless Tobacco Never  .   Review of Systems  Constitutional: Negative.   HENT: Negative.    Eyes: Negative.   Respiratory: Negative.    Cardiovascular: Negative.   Gastrointestinal: Negative.   Endocrine: Negative.   Genitourinary: Negative.   Musculoskeletal: Negative.   Skin: Negative.   Allergic/Immunologic: Negative.   Neurological: Negative.   Hematological: Negative.   Psychiatric/Behavioral: Negative.       Today's Vitals   07/12/22 0859 07/12/22 0935  BP: (!) 132/98 132/88  Pulse: 67   Temp: 97.9 F (36.6 C)   TempSrc: Oral   Weight: (!) 304 lb (137.9 kg)   Height: 6' (1.829 m)    Body mass index is 41.23 kg/m.  Wt Readings from Last 3 Encounters:  07/12/22 (!) 304 lb (137.9 kg)  02/03/22 290 lb (131.5 kg)  09/11/21 282 lb 9.6 oz (128.2 kg)    Objective:  Physical Exam Vitals reviewed.  Constitutional:      General: He is not in acute distress.    Appearance: Normal appearance. He is obese.  HENT:     Head: Normocephalic and atraumatic.     Right Ear: Tympanic membrane and external ear normal. There  is no impacted cerumen.     Left Ear: Tympanic membrane, ear canal and external ear normal. There is no impacted cerumen.     Ears:      Comments: Right ear has dried blood on canal area    Nose:     Comments: Deferred - masked    Mouth/Throat:     Comments: Deferred - masked Eyes:     Pupils: Pupils are equal, round, and reactive to light.  Cardiovascular:     Rate and Rhythm: Normal rate and regular rhythm.     Pulses: Normal pulses.     Heart sounds: Normal heart sounds. No murmur heard. Pulmonary:     Effort: Pulmonary effort is normal. No respiratory distress.     Breath sounds: Normal breath sounds. No wheezing.  Abdominal:     General: Abdomen is flat. Bowel sounds are normal. There is no distension.     Palpations: Abdomen is soft.     Tenderness:  There is no abdominal tenderness.  Genitourinary:    Prostate: Normal.     Rectum: Guaiac result negative (negative).  Musculoskeletal:        General: No swelling or tenderness. Normal range of motion.     Cervical back: Normal range of motion and neck supple.  Skin:    General: Skin is warm.     Capillary Refill: Capillary refill takes less than 2 seconds.  Neurological:     General: No focal deficit present.     Mental Status: He is alert and oriented to person, place, and time.     Cranial Nerves: No cranial nerve deficit.     Motor: No weakness.  Psychiatric:        Mood and Affect: Mood normal.        Behavior: Behavior normal.        Thought Content: Thought content normal.        Judgment: Judgment normal.         Assessment And Plan:    1. Encounter for general adult medical examination w/o abnormal findings Behavior modifications discussed and diet history reviewed.   Pt will continue to exercise regularly and modify diet with low GI, plant based foods and decrease intake of processed foods.  Recommend intake of daily multivitamin, Vitamin D, and calcium.  Recommend for preventive screenings, as well as recommend immunizations that include influenza, TDAP  2. Essential hypertension Comments: Blood pressure elevated, he was rushing to his appt. Repeat BP improved. EKG done with Sinus Bradycardia at HR 54. - POCT Urinalysis Dipstick (81002) - Microalbumin / Creatinine Urine Ratio - EKG 12-Lead - CMP14+EGFR - olmesartan (BENICAR) 20 MG tablet; Take 1 tablet (20 mg total) by mouth daily.  Dispense: 90 tablet; Refill: 1  3. Prediabetes Comments: Stable, continue current medications tolerating well.  - Hemoglobin A1c - Lipid panel - metFORMIN (GLUCOPHAGE) 500 MG tablet; Take 1 tablet (500 mg total) by mouth 2 (two) times daily with a meal.  Dispense: 180 tablet; Refill: 1  4. Class 3 severe obesity due to excess calories with serious comorbidity and body mass index  (BMI) of 40.0 to 44.9 in adult [facility]) Comments: He has gained approximately 10 lbs since his last visit and about 20 lbs since 08/2021. I have encouraged to increase his physical activity to at least 150 min/wk He is encouraged to initially strive for BMI less than 30 to decrease cardiac risk.   5. Obstructive sleep apnea treated with continuous positive airway pressure (  CPAP) Comments: Continues to wear CPAP and tolerating, he is to have a home sleep study but will call Neurology to set up and get f/u appt.   6. Encounter for prostate cancer screening - PSA  7. Other long term (current) drug therapy - CBC    Patient was given opportunity to ask questions. Patient verbalized understanding of the plan and was able to repeat key elements of the plan. All questions were answered to their satisfaction.   Minette Brine, FNP   I, Minette Brine, FNP, have reviewed all documentation for this visit. The documentation on 07/12/22 for the exam, diagnosis, procedures, and orders are all accurate and complete.   THE PATIENT IS ENCOURAGED TO PRACTICE SOCIAL DISTANCING DUE TO THE COVID-19 PANDEMIC.

## 2022-07-12 NOTE — Patient Instructions (Signed)
Health Maintenance, Male Adopting a healthy lifestyle and getting preventive care are important in promoting health and wellness. Ask your health care provider about: The right schedule for you to have regular tests and exams. Things you can do on your own to prevent diseases and keep yourself healthy. What should I know about diet, weight, and exercise? Eat a healthy diet  Eat a diet that includes plenty of vegetables, fruits, low-fat dairy products, and lean protein. Do not eat a lot of foods that are high in solid fats, added sugars, or sodium. Maintain a healthy weight Body mass index (BMI) is a measurement that can be used to identify possible weight problems. It estimates body fat based on height and weight. Your health care provider can help determine your BMI and help you achieve or maintain a healthy weight. Get regular exercise Get regular exercise. This is one of the most important things you can do for your health. Most adults should: Exercise for at least 150 minutes each week. The exercise should increase your heart rate and make you sweat (moderate-intensity exercise). Do strengthening exercises at least twice a week. This is in addition to the moderate-intensity exercise. Spend less time sitting. Even light physical activity can be beneficial. Watch cholesterol and blood lipids Have your blood tested for lipids and cholesterol at 43 years of age, then have this test every 5 years. You may need to have your cholesterol levels checked more often if: Your lipid or cholesterol levels are high. You are older than 43 years of age. You are at high risk for heart disease. What should I know about cancer screening? Many types of cancers can be detected early and may often be prevented. Depending on your health history and family history, you may need to have cancer screening at various ages. This may include screening for: Colorectal cancer. Prostate cancer. Skin cancer. Lung  cancer. What should I know about heart disease, diabetes, and high blood pressure? Blood pressure and heart disease High blood pressure causes heart disease and increases the risk of stroke. This is more likely to develop in people who have high blood pressure readings or are overweight. Talk with your health care provider about your target blood pressure readings. Have your blood pressure checked: Every 3-5 years if you are 18-39 years of age. Every year if you are 40 years old or older. If you are between the ages of 65 and 75 and are a current or former smoker, ask your health care provider if you should have a one-time screening for abdominal aortic aneurysm (AAA). Diabetes Have regular diabetes screenings. This checks your fasting blood sugar level. Have the screening done: Once every three years after age 45 if you are at a normal weight and have a low risk for diabetes. More often and at a younger age if you are overweight or have a high risk for diabetes. What should I know about preventing infection? Hepatitis B If you have a higher risk for hepatitis B, you should be screened for this virus. Talk with your health care provider to find out if you are at risk for hepatitis B infection. Hepatitis C Blood testing is recommended for: Everyone born from 1945 through 1965. Anyone with known risk factors for hepatitis C. Sexually transmitted infections (STIs) You should be screened each year for STIs, including gonorrhea and chlamydia, if: You are sexually active and are younger than 43 years of age. You are older than 43 years of age and your   health care provider tells you that you are at risk for this type of infection. Your sexual activity has changed since you were last screened, and you are at increased risk for chlamydia or gonorrhea. Ask your health care provider if you are at risk. Ask your health care provider about whether you are at high risk for HIV. Your health care provider  may recommend a prescription medicine to help prevent HIV infection. If you choose to take medicine to prevent HIV, you should first get tested for HIV. You should then be tested every 3 months for as long as you are taking the medicine. Follow these instructions at home: Alcohol use Do not drink alcohol if your health care provider tells you not to drink. If you drink alcohol: Limit how much you have to 0-2 drinks a day. Know how much alcohol is in your drink. In the [place] one drink equals one 12 oz bottle of beer (355 mL), one 5 oz glass of wine (148 mL), or one 1 oz glass of hard liquor (44 mL). Lifestyle Do not use any products that contain nicotine or tobacco. These products include cigarettes, chewing tobacco, and vaping devices, such as e-cigarettes. If you need help quitting, ask your health care provider. Do not use street drugs. Do not share needles. Ask your health care provider for help if you need support or information about quitting drugs. General instructions Schedule regular health, dental, and eye exams. Stay current with your vaccines. Tell your health care provider if: You often feel depressed. You have ever been abused or do not feel safe at home. Summary Adopting a healthy lifestyle and getting preventive care are important in promoting health and wellness. Follow your health care provider's instructions about healthy diet, exercising, and getting tested or screened for diseases. Follow your health care provider's instructions on monitoring your cholesterol and blood pressure. This information is not intended to replace advice given to you by your health care provider. Make sure you discuss any questions you have with your health care provider. Document Revised: 03/02/2021 Document Reviewed: 03/02/2021 Elsevier Patient Education  2023 [employer]  

## 2022-07-13 LAB — CBC
Hematocrit: 43.4 % (ref 37.5–51.0)
Hemoglobin: 14.4 g/dL (ref 13.0–17.7)
MCH: 27.5 pg (ref 26.6–33.0)
MCHC: 33.2 g/dL (ref 31.5–35.7)
MCV: 83 fL (ref 79–97)
Platelets: 219 10*3/uL (ref 150–450)
RBC: 5.24 x10E6/uL (ref 4.14–5.80)
RDW: 12.7 % (ref 11.6–15.4)
WBC: 6.1 10*3/uL (ref 3.4–10.8)

## 2022-07-13 LAB — MICROALBUMIN / CREATININE URINE RATIO
Creatinine, Urine: 132.3 mg/dL
Microalb/Creat Ratio: <2 mg/g creat (ref 0–29)
Microalbumin, Urine: <3 ug/mL

## 2022-07-13 LAB — CMP14+EGFR
ALT: 22 IU/L (ref 0–44)
AST: 21 IU/L (ref 0–40)
Albumin/Globulin Ratio: 1.6 (ref 1.2–2.2)
Albumin: 4.3 g/dL (ref 4.1–5.1)
Alkaline Phosphatase: 96 IU/L (ref 44–121)
BUN/Creatinine Ratio: 10 (ref 9–20)
BUN: 10 mg/dL (ref 6–24)
Bilirubin Total: 0.4 mg/dL (ref 0.0–1.2)
CO2: 23 mmol/L (ref 20–29)
Calcium: 9.1 mg/dL (ref 8.7–10.2)
Chloride: 102 mmol/L (ref 96–106)
Creatinine, Ser: 0.98 mg/dL (ref 0.76–1.27)
Globulin, Total: 2.7 g/dL (ref 1.5–4.5)
Glucose: 95 mg/dL (ref 70–99)
Potassium: 4.3 mmol/L (ref 3.5–5.2)
Sodium: 137 mmol/L (ref 134–144)
Total Protein: 7 g/dL (ref 6.0–8.5)
eGFR: 98 mL/min/{1.73_m2} (ref >59)

## 2022-07-13 LAB — PSA: Prostate Specific Ag, Serum: 0.8 ng/mL (ref 0.0–4.0)

## 2022-07-13 LAB — LIPID PANEL
Chol/HDL Ratio: 2.9 ratio (ref 0.0–5.0)
Cholesterol, Total: 172 mg/dL (ref 100–199)
HDL: 59 mg/dL (ref >39)
LDL Chol Calc (NIH): 102 mg/dL — ABNORMAL HIGH (ref 0–99)
Triglycerides: 58 mg/dL (ref 0–149)
VLDL Cholesterol Cal: 11 mg/dL (ref 5–40)

## 2022-07-13 LAB — HEMOGLOBIN A1C
Est. average glucose Bld gHb Est-mCnc: 120 mg/dL
Hgb A1c MFr Bld: 5.8 % — ABNORMAL HIGH (ref 4.8–5.6)

## 2022-08-03 ENCOUNTER — Encounter: Payer: 59 | Admitting: Nurse Practitioner

## 2022-08-27 IMAGING — US US EXTREM LOW VENOUS*L*
1 series · 13 of 24 positions shown · non-contrast
Comparison: None.

CLINICAL DATA: 40-year-old male with left calf swelling.

EXAM:
LEFT LOWER EXTREMITY VENOUS DOPPLER ULTRASOUND
TECHNIQUE: Gray-scale sonography with graded compression, as well as color
Doppler and duplex ultrasound were performed to evaluate the left
lower extremity deep venous systems from the level of the common
femoral vein and including the common femoral, femoral, profunda
femoral, popliteal and calf veins including the posterior tibial,
peroneal and gastrocnemius veins when visible. Spectral Doppler was
utilized to evaluate flow at rest and with distal augmentation
maneuvers in the common femoral, femoral and popliteal veins. The
contralateral common femoral vein was also evaluated for comparison.

[Series 1: us extrem low venous*left* · 49 acquisitions, 13 frames shown]
[im 1/49]
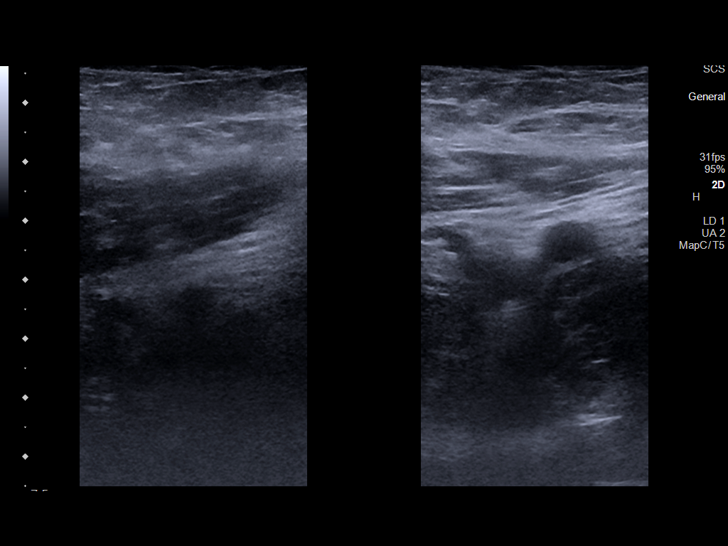
[im 5/49]
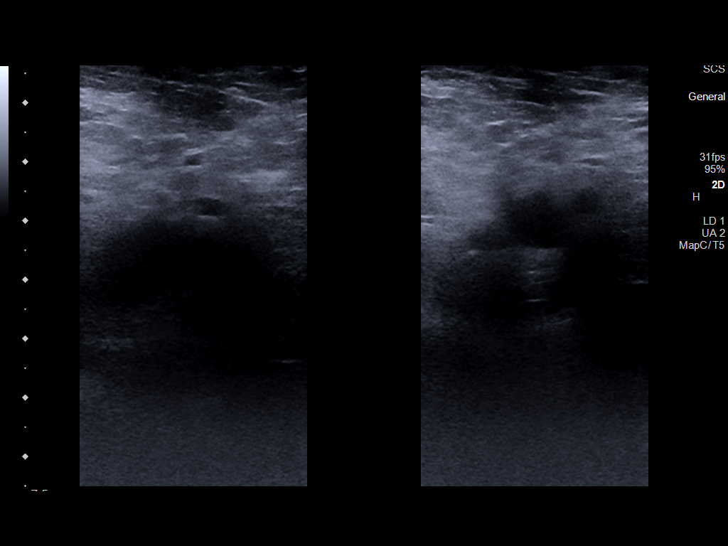
[im 9/49]
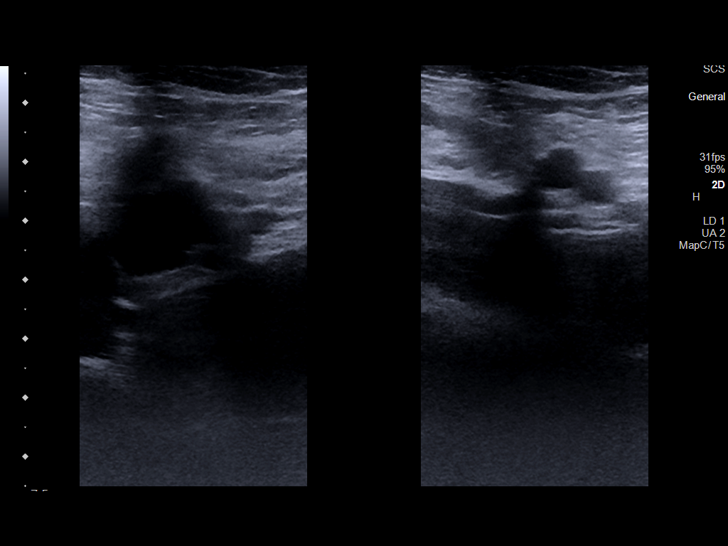
[im 13/49]
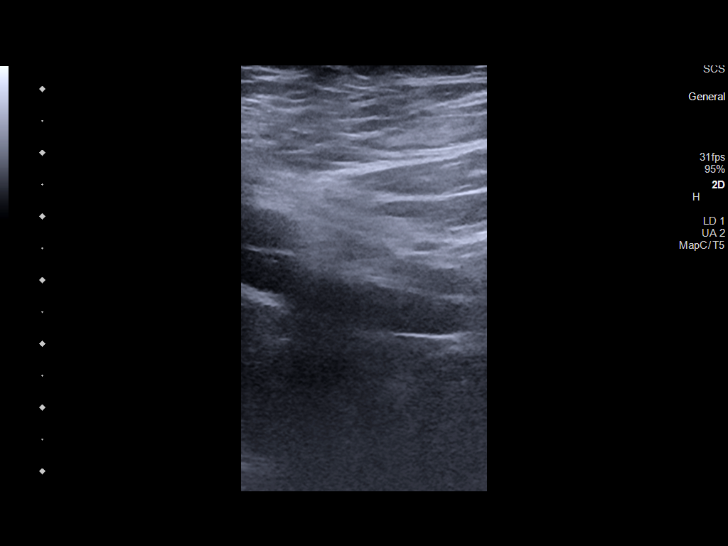
[im 17/49]
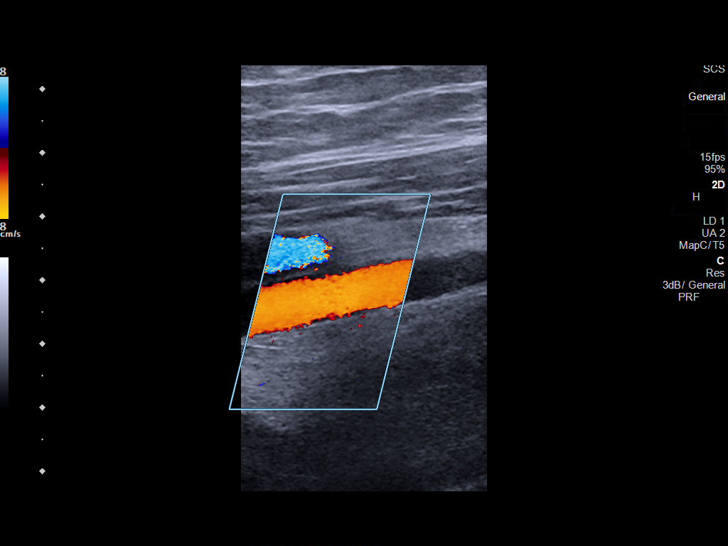
[im 19/49]
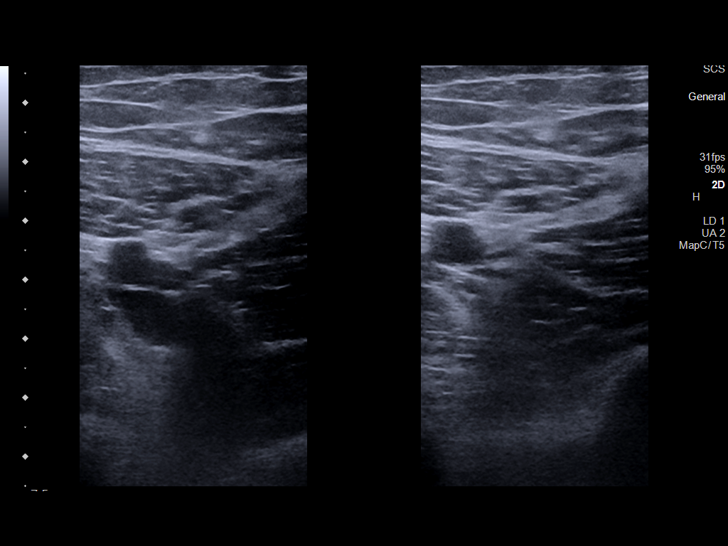
[im 26/49]
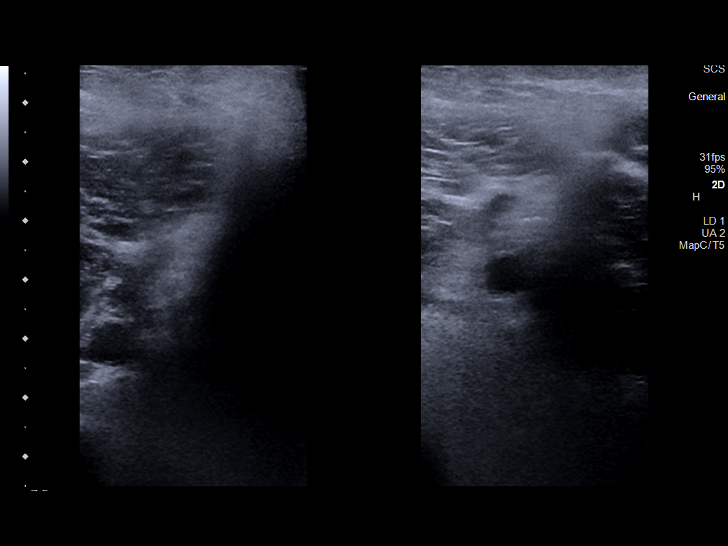
[im 28/49]
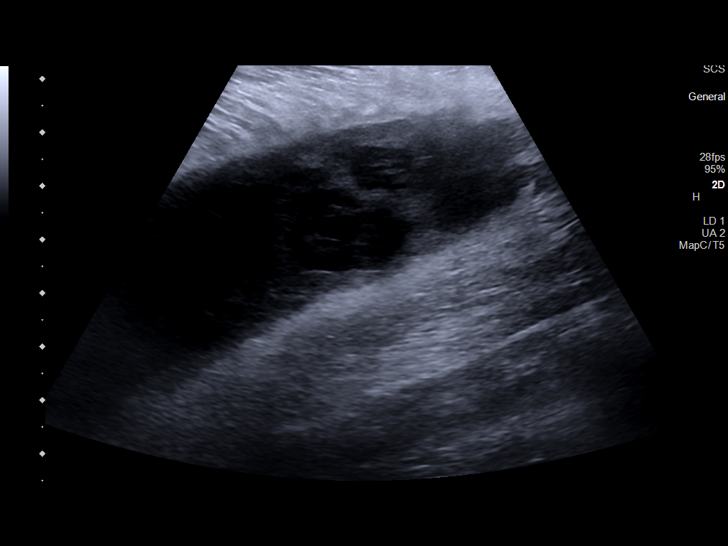
[im 32/49]
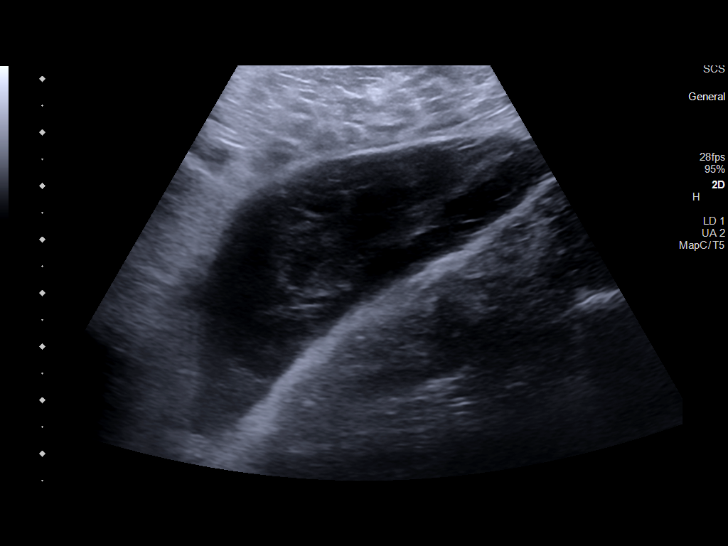
[im 36/49]
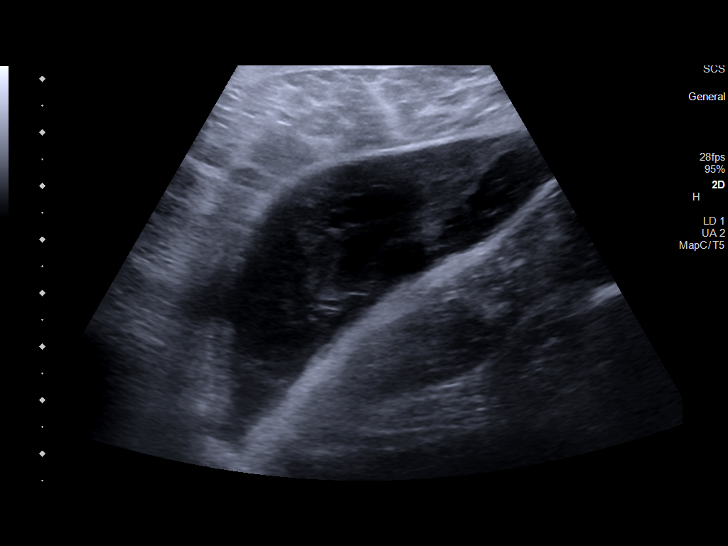
[im 40/49]
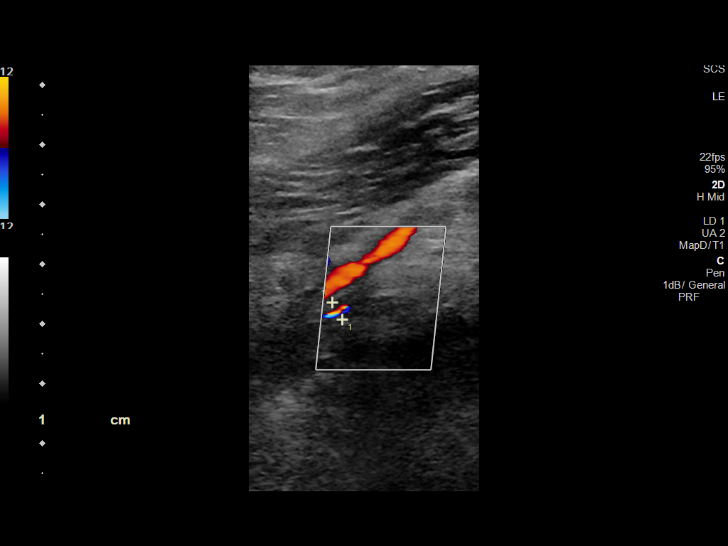
[im 44/49]
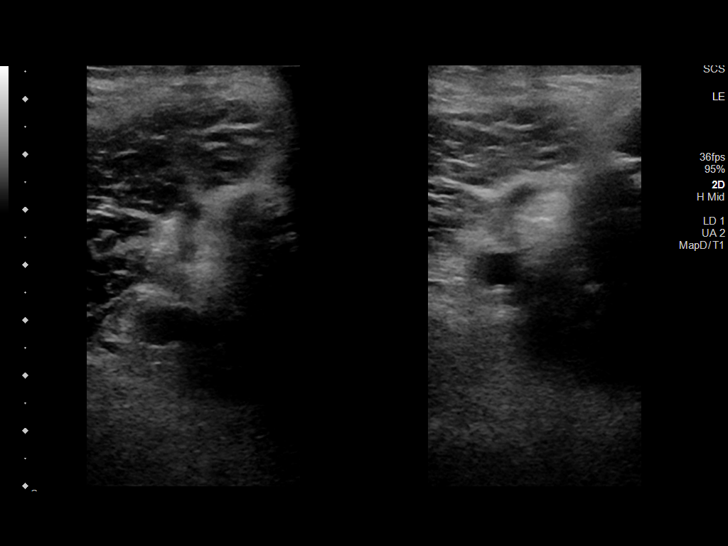
[im 49/49]
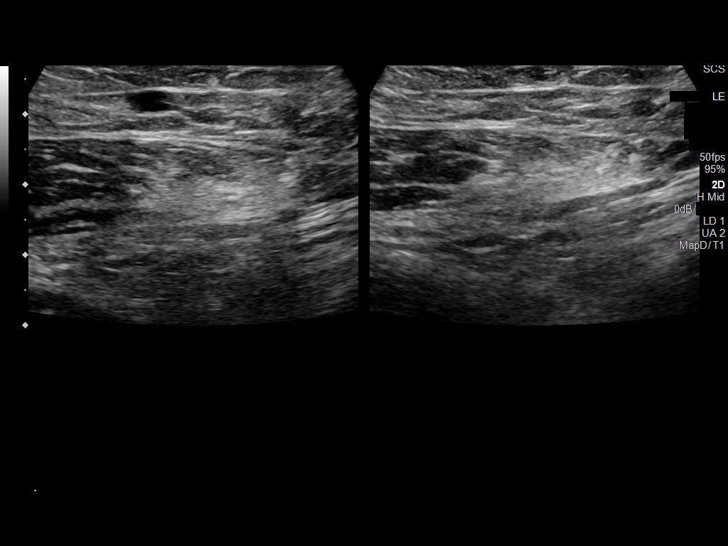

[13 of 24 positions shown; findings below may reference images not displayed]

FINDINGS: LEFT LOWER EXTREMITY

Common Femoral Vein: No evidence of thrombus. Normal
compressibility, respiratory phasicity and response to augmentation.

Central Greater Saphenous Vein: No evidence of thrombus. Normal
compressibility and flow on color Doppler imaging.

Central Profunda Femoral Vein: No evidence of thrombus. Normal
compressibility and flow on color Doppler imaging.

Femoral Vein: No evidence of thrombus. Normal compressibility,
respiratory phasicity and response to augmentation.

Popliteal Vein: Poorly visualized.

Calf Veins: Poorly visualized.

Other Findings: Interposed between the gastrocnemius and soleus
muscles in medial aspect of the calf is a well-defined,
heterogeneously hypoechoic structure measuring approximately 10.2 x
2.9 x 5.0 cm without appreciable internal vascularity.

RIGHT LOWER EXTREMITY

Common Femoral Vein: No evidence of thrombus. Normal
compressibility, respiratory phasicity and response to augmentation.
IMPRESSION: 1. No evidence of left lower extremity deep vein thrombosis. There
is poor visualization of the popliteal and calf veins.
2. Intermuscular, mildly complex fluid collection in the left calf
measuring up to 10.2 cm, favored to represent hematoma. Left lower
extremity MRI could be considered for further characterization as
clinically indicated.

## 2022-11-11 ENCOUNTER — Ambulatory Visit: Payer: No Typology Code available for payment source | Admitting: Nurse Practitioner

## 2023-07-18 NOTE — Progress Notes (Unsigned)
Madelaine Bhat, CMA,acting as a Neurosurgeon for Arnette Felts, FNP.,have documented all relevant documentation on the behalf of Arnette Felts, FNP,as directed by  Arnette Felts, FNP while in the presence of Arnette Felts, FNP.  Subjective:   Patient ID: [name] [name] , male    DOB: 1981/10/05 , 42 y.o.   MRN: [id number]  No chief complaint on file.   HPI  Patient presents today for HM, Patient reports compliance with medication. Patient denies any chest pain, SOB, or headaches. Patient has no concerns today.    Past Medical History:  Diagnosis Date  . Benign hypertension   . Morbid obesity (HCC)      Family History  Problem Relation Age of Onset  . Hypertension Mother   . Hypertension Father      Current Outpatient Medications:  .  blood glucose meter kit and supplies KIT, Dispense based on patient and insurance preference. Use up to four times daily as directed., Disp: 1 each, Rfl: 0 .  metFORMIN (GLUCOPHAGE) 500 MG tablet, Take 1 tablet (500 mg total) by mouth 2 (two) times daily with a meal., Disp: 180 tablet, Rfl: 1 .  olmesartan (BENICAR) 20 MG tablet, Take 1 tablet (20 mg total) by mouth daily., Disp: 90 tablet, Rfl: 1   Allergies  Allergen Reactions  . Fish Allergy Anaphylaxis     Men's preventive visit. Patient Health Questionnaire (PHQ-2) is  Flowsheet Row Office Visit from 07/12/2022 in [facility]  PHQ-2 Total Score 0     . Patient is on a *** diet. Marital status: Single. Relevant history for alcohol use is:  Social History   Substance and Sexual Activity  Alcohol Use None  . Relevant history for tobacco use is:  Social History   Tobacco Use  Smoking Status Never  Smokeless Tobacco Never  .   Review of Systems  Constitutional: Negative.   HENT: Negative.    Eyes: Negative.   Respiratory: Negative.    Cardiovascular: Negative.   Gastrointestinal: Negative.   Endocrine: Negative.   Genitourinary: Negative.    Musculoskeletal: Negative.   Skin: Negative.   Allergic/Immunologic: Negative.   Hematological: Negative.   Psychiatric/Behavioral: Negative.      There were no vitals filed for this visit. There is no height or weight on file to calculate BMI.  Wt Readings from Last 3 Encounters:  07/12/22 (!) 304 lb (137.9 kg)  02/03/22 290 lb (131.5 kg)  09/11/21 282 lb 9.6 oz (128.2 kg)    Objective:  Physical Exam      Assessment And Plan:    Essential hypertension  Encounter for annual health examination  Prediabetes  Elevated LDL cholesterol level  Obstructive sleep apnea treated with continuous positive airway pressure (CPAP)     No follow-ups on file. Patient was given opportunity to ask questions. Patient verbalized understanding of the plan and was able to repeat key elements of the plan. All questions were answered to their satisfaction.   Arnette Felts, FNP  I, Arnette Felts, FNP, have reviewed all documentation for this visit. The documentation on 07/18/23 for the exam, diagnosis, procedures, and orders are all accurate and complete.

## 2023-07-19 ENCOUNTER — Encounter: Payer: Commercial Managed Care - HMO | Admitting: Nurse Practitioner

## 2023-07-19 DIAGNOSIS — E7800 Pure hypercholesterolemia, unspecified: Secondary | ICD-10-CM

## 2023-07-19 DIAGNOSIS — Z0000 Encounter for general adult medical examination without abnormal findings: Secondary | ICD-10-CM

## 2023-07-19 DIAGNOSIS — R7303 Prediabetes: Secondary | ICD-10-CM

## 2023-07-19 DIAGNOSIS — G4733 Obstructive sleep apnea (adult) (pediatric): Secondary | ICD-10-CM

## 2023-07-19 DIAGNOSIS — I10 Essential (primary) hypertension: Secondary | ICD-10-CM

## 2024-09-27 ENCOUNTER — Telehealth: Payer: Self-pay

## 2024-09-27 NOTE — Telephone Encounter (Signed)
 Attempted to call no VM- to see if he was still a patient here if so he needs to schedule appt ASAP.
